# Patient Record
Sex: Female | Born: 1955 | Hispanic: Yes | Marital: Married | Smoking: Never smoker
Health system: Southern US, Community
[De-identification: ages and names within clinical notes are randomized; demographics above are authoritative.]

## PROBLEM LIST (undated history)

## (undated) DIAGNOSIS — R32 Unspecified urinary incontinence: Secondary | ICD-10-CM

## (undated) DIAGNOSIS — N952 Postmenopausal atrophic vaginitis: Secondary | ICD-10-CM

## (undated) DIAGNOSIS — IMO0002 Reserved for concepts with insufficient information to code with codable children: Secondary | ICD-10-CM

## (undated) DIAGNOSIS — E119 Type 2 diabetes mellitus without complications: Secondary | ICD-10-CM

## (undated) DIAGNOSIS — I10 Essential (primary) hypertension: Secondary | ICD-10-CM

## (undated) HISTORY — PX: CHOLECYSTECTOMY: SHX55

## (undated) HISTORY — DX: Unspecified urinary incontinence: R32

## (undated) HISTORY — DX: Essential (primary) hypertension: I10

## (undated) HISTORY — DX: Reserved for concepts with insufficient information to code with codable children: IMO0002

## (undated) HISTORY — PX: TUBAL LIGATION: SHX77

## (undated) HISTORY — DX: Type 2 diabetes mellitus without complications: E11.9

## (undated) HISTORY — PX: SHOULDER SURGERY: SHX246

## (undated) HISTORY — DX: Postmenopausal atrophic vaginitis: N95.2

---

## 1999-01-12 ENCOUNTER — Encounter: Payer: Self-pay | Admitting: Emergency Medicine

## 1999-01-12 ENCOUNTER — Emergency Department (HOSPITAL_COMMUNITY): Admission: EM | Admit: 1999-01-12 | Discharge: 1999-01-12 | Payer: Self-pay

## 1999-08-23 ENCOUNTER — Emergency Department (HOSPITAL_COMMUNITY): Admission: EM | Admit: 1999-08-23 | Discharge: 1999-08-23 | Payer: Self-pay | Admitting: Emergency Medicine

## 1999-10-05 ENCOUNTER — Encounter: Admission: RE | Admit: 1999-10-05 | Discharge: 1999-10-05 | Payer: Self-pay | Admitting: Orthopedic Surgery

## 1999-10-05 ENCOUNTER — Encounter: Payer: Self-pay | Admitting: Orthopedic Surgery

## 1999-11-23 ENCOUNTER — Other Ambulatory Visit: Admission: RE | Admit: 1999-11-23 | Discharge: 1999-11-23 | Payer: Self-pay | Admitting: Obstetrics and Gynecology

## 1999-11-28 ENCOUNTER — Encounter: Payer: Self-pay | Admitting: Obstetrics and Gynecology

## 1999-11-28 ENCOUNTER — Ambulatory Visit (HOSPITAL_COMMUNITY): Admission: RE | Admit: 1999-11-28 | Discharge: 1999-11-28 | Payer: Self-pay | Admitting: Obstetrics and Gynecology

## 1999-11-29 ENCOUNTER — Encounter: Payer: Self-pay | Admitting: Obstetrics and Gynecology

## 1999-11-29 ENCOUNTER — Ambulatory Visit (HOSPITAL_COMMUNITY): Admission: RE | Admit: 1999-11-29 | Discharge: 1999-11-29 | Payer: Self-pay | Admitting: Obstetrics and Gynecology

## 2002-02-23 ENCOUNTER — Emergency Department (HOSPITAL_COMMUNITY): Admission: EM | Admit: 2002-02-23 | Discharge: 2002-02-24 | Payer: Self-pay | Admitting: Emergency Medicine

## 2003-06-08 ENCOUNTER — Other Ambulatory Visit: Admission: RE | Admit: 2003-06-08 | Discharge: 2003-06-08 | Payer: Self-pay | Admitting: Gynecology

## 2003-07-06 ENCOUNTER — Encounter: Admission: RE | Admit: 2003-07-06 | Discharge: 2003-07-06 | Payer: Self-pay | Admitting: Obstetrics and Gynecology

## 2008-03-09 ENCOUNTER — Inpatient Hospital Stay (HOSPITAL_COMMUNITY): Admission: AD | Admit: 2008-03-09 | Discharge: 2008-03-09 | Payer: Self-pay | Admitting: Family Medicine

## 2008-09-07 ENCOUNTER — Other Ambulatory Visit: Admission: RE | Admit: 2008-09-07 | Discharge: 2008-09-07 | Payer: Self-pay | Admitting: Obstetrics & Gynecology

## 2009-06-15 ENCOUNTER — Ambulatory Visit: Payer: Self-pay | Admitting: Obstetrics and Gynecology

## 2009-06-15 LAB — CONVERTED CEMR LAB
Platelets: 509 10*3/uL — ABNORMAL HIGH (ref 150–400)
RBC: 4.96 M/uL (ref 3.87–5.11)
WBC: 8.6 10*3/uL (ref 4.0–10.5)

## 2009-06-23 ENCOUNTER — Ambulatory Visit (HOSPITAL_COMMUNITY): Admission: RE | Admit: 2009-06-23 | Discharge: 2009-06-23 | Payer: Self-pay | Admitting: Obstetrics & Gynecology

## 2009-07-07 ENCOUNTER — Encounter: Payer: Self-pay | Admitting: Obstetrics & Gynecology

## 2009-07-07 ENCOUNTER — Ambulatory Visit: Payer: Self-pay | Admitting: Obstetrics & Gynecology

## 2009-07-07 LAB — CONVERTED CEMR LAB
HCT: 29.5 % — ABNORMAL LOW (ref 36.0–46.0)
MCV: 57.7 fL — ABNORMAL LOW (ref 78.0–100.0)
RBC: 5.11 M/uL (ref 3.87–5.11)
TSH: 2.208 microintl units/mL (ref 0.350–4.500)
WBC: 8.4 10*3/uL (ref 4.0–10.5)

## 2009-07-13 ENCOUNTER — Ambulatory Visit (HOSPITAL_COMMUNITY): Admission: RE | Admit: 2009-07-13 | Discharge: 2009-07-13 | Payer: Self-pay | Admitting: Obstetrics & Gynecology

## 2009-10-13 ENCOUNTER — Ambulatory Visit: Payer: Self-pay | Admitting: Obstetrics & Gynecology

## 2009-11-16 ENCOUNTER — Ambulatory Visit (HOSPITAL_COMMUNITY): Admission: RE | Admit: 2009-11-16 | Discharge: 2009-11-16 | Payer: Self-pay | Admitting: Obstetrics & Gynecology

## 2009-11-16 ENCOUNTER — Ambulatory Visit: Payer: Self-pay | Admitting: Obstetrics & Gynecology

## 2009-12-14 ENCOUNTER — Ambulatory Visit: Payer: Self-pay | Admitting: Obstetrics and Gynecology

## 2010-07-23 HISTORY — PX: ENDOMETRIAL ABLATION: SHX621

## 2010-10-10 LAB — CBC
HCT: 31.1 % — ABNORMAL LOW (ref 36.0–46.0)
Hemoglobin: 9.6 g/dL — ABNORMAL LOW (ref 12.0–15.0)
MCV: 64.3 fL — ABNORMAL LOW (ref 78.0–100.0)
Platelets: 399 10*3/uL (ref 150–400)
WBC: 9.1 10*3/uL (ref 4.0–10.5)

## 2010-10-10 LAB — BASIC METABOLIC PANEL
BUN: 6 mg/dL (ref 6–23)
CO2: 26 mEq/L (ref 19–32)
Chloride: 103 mEq/L (ref 96–112)
Creatinine, Ser: 0.56 mg/dL (ref 0.4–1.2)
Potassium: 3.6 mEq/L (ref 3.5–5.1)

## 2011-03-18 IMAGING — US US TRANSVAGINAL NON-OB
1 series · 13 of 25 positions shown · non-contrast
Comparison: Report from exam dated 11/28/1999

CLINICAL DATA: History of fibroids.  Dysfunctional uterine
bleeding.  LMP 06/14/2009

TRANSABDOMINAL AND TRANSVAGINAL ULTRASOUND OF PELVIS
TECHNIQUE: Both transabdominal and transvaginal ultrasound
examinations of the pelvis were performed including evaluation of
the uterus, ovaries, adnexal regions, and pelvic cul-de-sac.

[Series 1: us pelvis complete modify · 0.27mm/px · 13 of 37 slices shown]
[im 1/37]
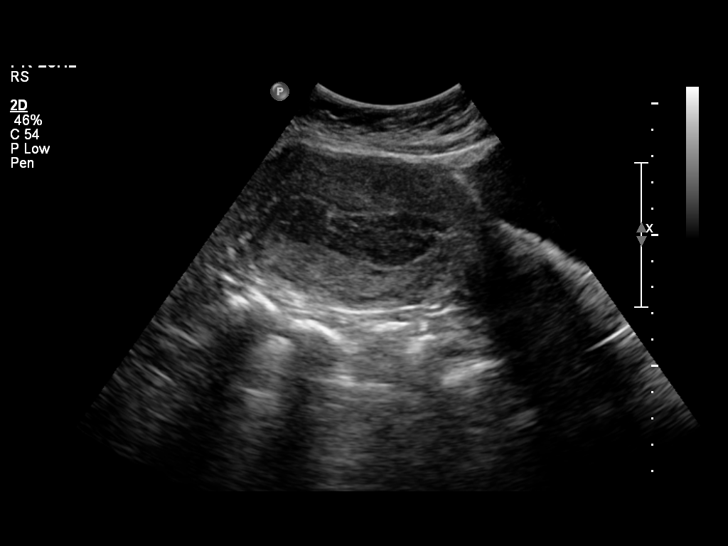
[im 4/37]
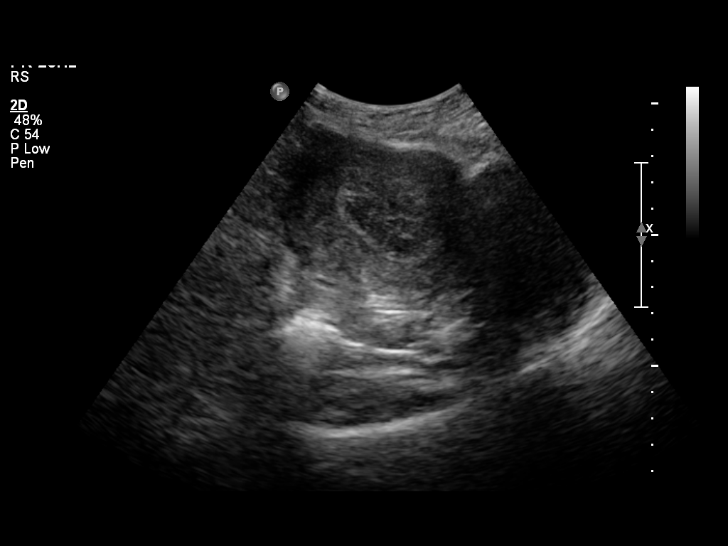
[im 7/37]
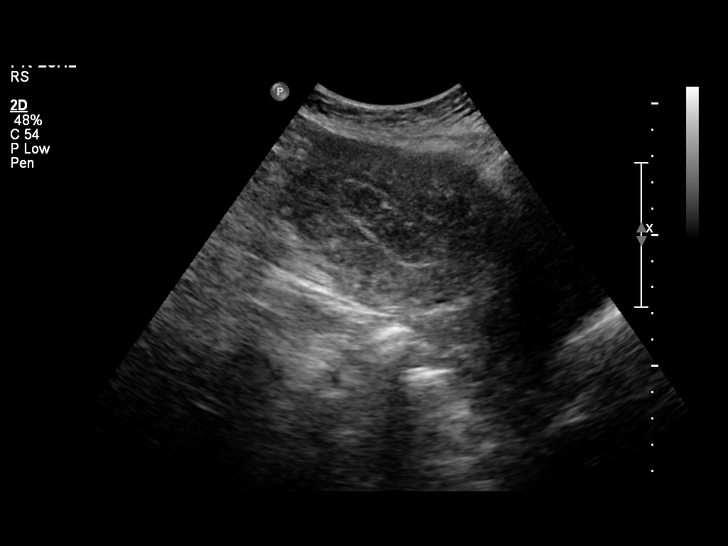
[im 10/37]
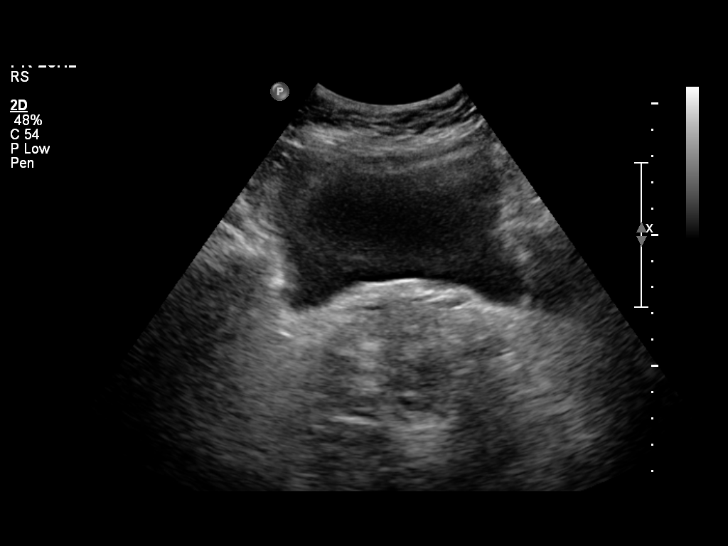
[im 13/37]
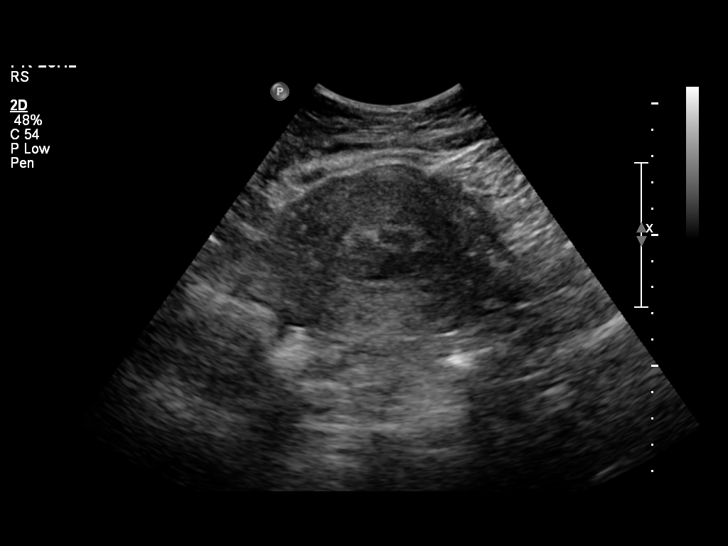
[im 16/37]
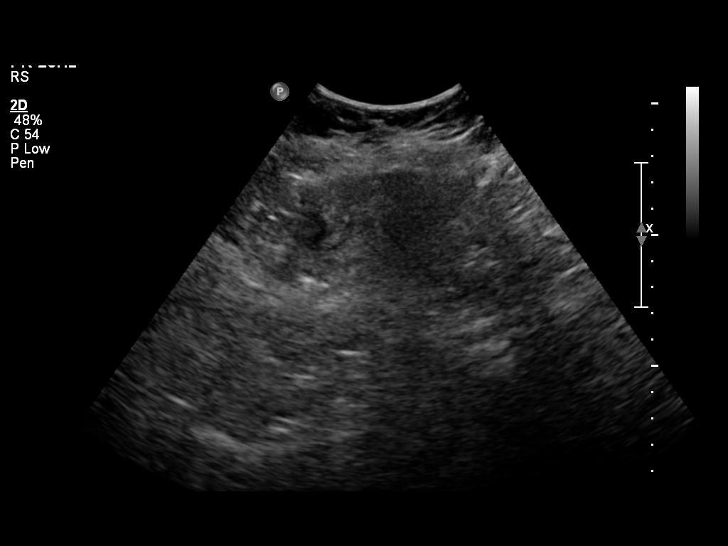
[im 19/37]
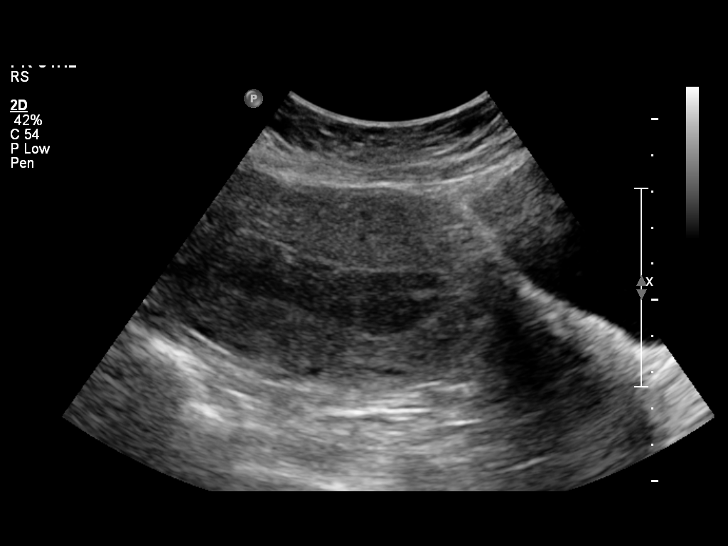
[im 22/37]
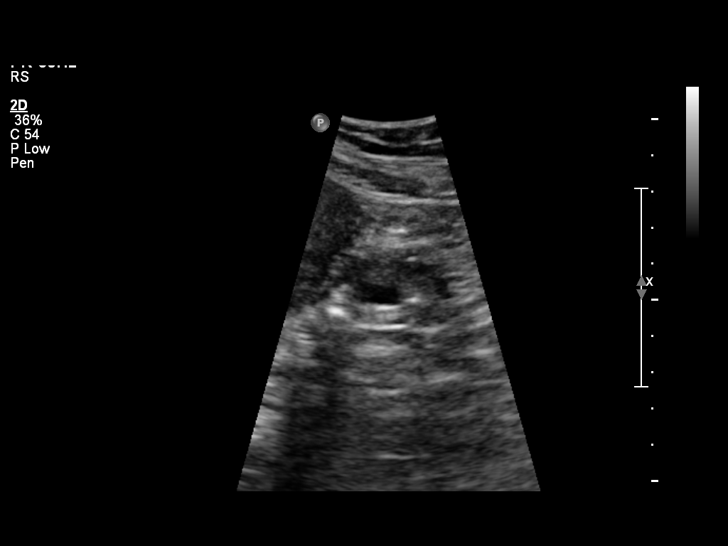
[im 25/37]
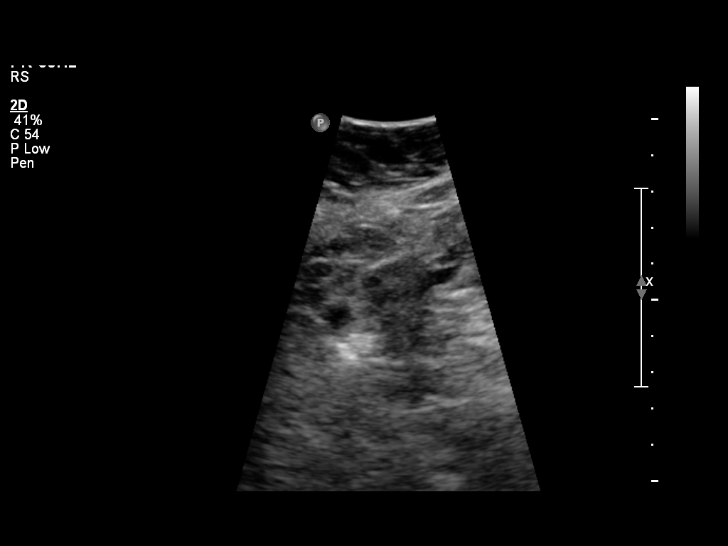
[im 28/37]
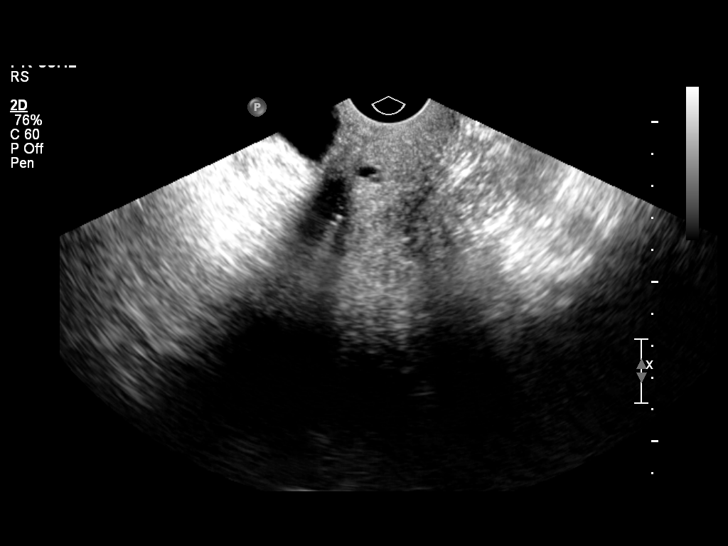
[im 31/37]
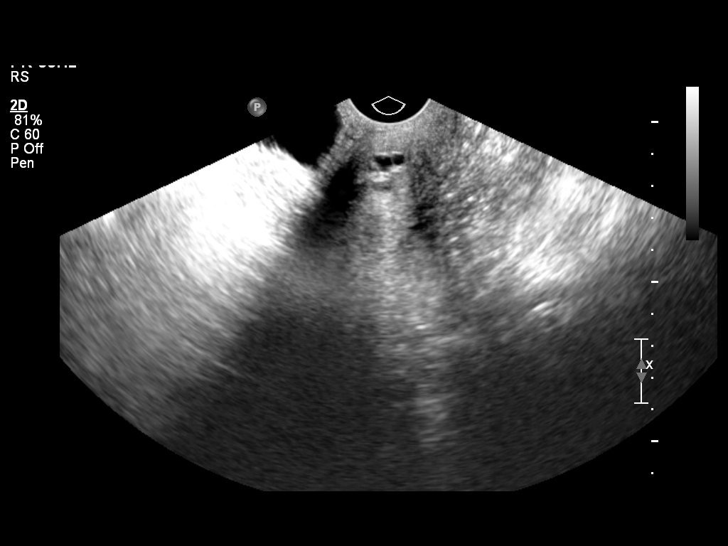
[im 34/37]
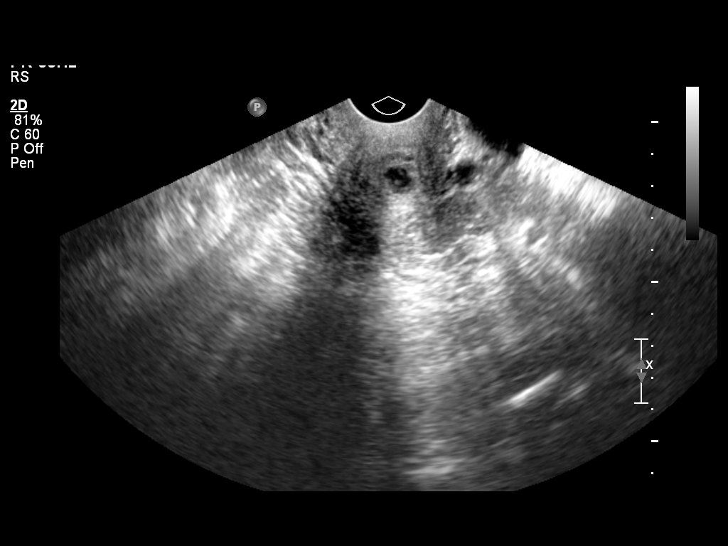
[im 37/37]
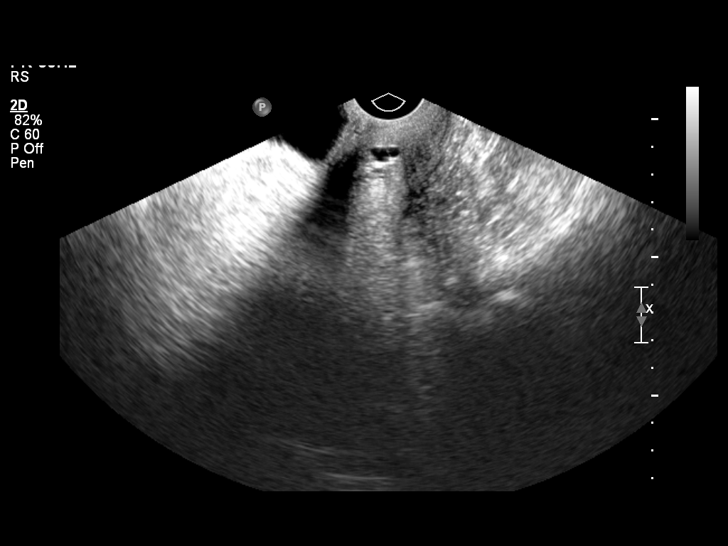

[13 of 25 positions shown; findings below may reference images not displayed]

FINDINGS: Uterus is enlarged with a sagittal length of 14.7 cm, an AP width
of 6.2 cm and a transverse width of 8.5 cm.  The uterus is
evaluated only transabdominally.  The uterus could not be visualize
above the level of the cervix endovaginally. No definite focal
myometrial abnormality is suggested with transabdominal assessment.

Endometrium The endometrial canal appears to be distended with
hypoechoic soft tissue surrounded by a thin echogenic line.  This
has an AP width of 2.1 cm and shows no flow with color doppler
assessment. I suspect that this represents intraluminal blood with
a thin surrounding endometrial lining.  Because of poor endovaginal
penetration and the large uterine size, this could not be confirmed
endovaginally.

Right Ovary has a normal transabdominal appearance measuring 3.0 x
1.6 x 2.0 cm

Left Ovary has a normal transabdominal appearance measuring 3.2 x
1.9 x 2.5 cm

Other Findings:  No pelvic fluid or separate adnexal masses are
seen.
IMPRESSION: Pelvic evaluation could only be obtained transabdominally due to
the large uterine size and poor penetration beyond the level of the
cervix endovaginally.  The uterus is enlarged with no definite
focal fibroids seen.  The endometrial canal appears distended by
intraluminal blood with the endometrial lining itself appearing
thin transabdominally.  Because of the poor visibility
endovaginally and the question of retained blood within the
endometrial canal, further assessment with MRI would be useful to
confirm this impression and evaluate for the possibility of a
partial obstructive process in the lower uterine segment which was
the least well seen portion of the uterus.

Normal ovaries

## 2011-08-18 ENCOUNTER — Ambulatory Visit: Payer: Self-pay

## 2011-09-06 ENCOUNTER — Ambulatory Visit: Payer: Self-pay | Admitting: Physician Assistant

## 2011-09-06 DIAGNOSIS — N760 Acute vaginitis: Secondary | ICD-10-CM

## 2011-09-06 DIAGNOSIS — N949 Unspecified condition associated with female genital organs and menstrual cycle: Secondary | ICD-10-CM

## 2011-09-06 LAB — POCT WET PREP WITH KOH
Epithelial Wet Prep HPF POC: NEGATIVE
KOH Prep POC: NEGATIVE
Yeast Wet Prep HPF POC: NEGATIVE

## 2011-09-06 MED ORDER — CEFTRIAXONE SODIUM 1 G IJ SOLR
250.0000 mg | Freq: Once | INTRAMUSCULAR | Status: AC
  Administered 2011-09-06: 250 mg via INTRAMUSCULAR

## 2011-09-06 MED ORDER — AZITHROMYCIN 1 G PO PACK: 1.0000 | PACK | Freq: Once | ORAL | Status: AC

## 2011-09-06 NOTE — Patient Instructions (Signed)
No sexual activity until after results are back because husband may also need treatment.

## 2011-09-06 NOTE — Progress Notes (Signed)
Patient ID: Sandra Gardner, female   DOB: 1956/03/18, 56 y.o.   MRN: [id number] C/o vaginal discharge, burning, and itching. Present x 3 weeks. Monogamous with husband.  Denies STD risk factors.  ROS: Neg other than mild pelvic achiness.  O: lungs CTA B Heart RRR w/o m/g/r Pelvic: vulva and ext. Anatomy w/in normal limits.  No lesions. Purulent appearing vaginal d/c.  Cervix erythematous.  Wet prep and gen probe taken.  Bimanual unremarkable.  Results for orders placed in visit on 09/06/11  POCT WET PREP WITH KOH      Component Value Range   Trichomonas, UA Negative     Clue Cells Wet Prep HPF POC 3-4     Epithelial Wet Prep HPF POC neg     Yeast Wet Prep HPF POC neg     Bacteria Wet Prep HPF POC 4+     RBC Wet Prep HPF POC 2-3     WBC Wet Prep HPF POC tntc     KOH Prep POC Negative       A/P  Likely cervicitis. Cover for Chlamydia and Gonorrhea. 250mg  rocephin IM now, 1g azithromycin now.  Husband came in room after exam.  Discussed that he will likely need treatment too.  No sex until results back.

## 2011-09-12 ENCOUNTER — Telehealth: Payer: Self-pay

## 2011-09-12 NOTE — Telephone Encounter (Signed)
Lmom of nl labs

## 2011-09-12 NOTE — Telephone Encounter (Signed)
Patient returning nurse phone call she is calling about lab results

## 2013-05-02 ENCOUNTER — Encounter (HOSPITAL_COMMUNITY): Payer: Self-pay | Admitting: Emergency Medicine

## 2013-05-02 ENCOUNTER — Emergency Department (HOSPITAL_COMMUNITY)
Admission: EM | Admit: 2013-05-02 | Discharge: 2013-05-02 | Disposition: A | Discharge status: Home / Self Care | Payer: Self-pay | Attending: Emergency Medicine | Admitting: Emergency Medicine

## 2013-05-02 DIAGNOSIS — S0990XA Unspecified injury of head, initial encounter: Secondary | ICD-10-CM | POA: Insufficient documentation

## 2013-05-02 DIAGNOSIS — S139XXA Sprain of joints and ligaments of unspecified parts of neck, initial encounter: Secondary | ICD-10-CM | POA: Insufficient documentation

## 2013-05-02 DIAGNOSIS — R011 Cardiac murmur, unspecified: Secondary | ICD-10-CM | POA: Insufficient documentation

## 2013-05-02 DIAGNOSIS — Y9389 Activity, other specified: Secondary | ICD-10-CM | POA: Insufficient documentation

## 2013-05-02 DIAGNOSIS — R42 Dizziness and giddiness: Secondary | ICD-10-CM | POA: Insufficient documentation

## 2013-05-02 DIAGNOSIS — IMO0002 Reserved for concepts with insufficient information to code with codable children: Secondary | ICD-10-CM | POA: Insufficient documentation

## 2013-05-02 DIAGNOSIS — R519 Headache, unspecified: Secondary | ICD-10-CM

## 2013-05-02 DIAGNOSIS — Y990 Civilian activity done for income or pay: Secondary | ICD-10-CM | POA: Insufficient documentation

## 2013-05-02 DIAGNOSIS — S161XXA Strain of muscle, fascia and tendon at neck level, initial encounter: Secondary | ICD-10-CM

## 2013-05-02 DIAGNOSIS — Y929 Unspecified place or not applicable: Secondary | ICD-10-CM | POA: Insufficient documentation
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
## 2013-05-08 NOTE — ED Provider Notes (Signed)
I saw and evaluated the patient, reviewed the resident's note and I agree with the findings and plan.   .Face to face Exam:  General:  Awake HEENT: PERRLA Resp:  Normal effort Abd:  Nondistended Neuro:No focal weakness Lymph: No adenopathy  Nelia Shi, MD 05/08/13 [phone]

## 2013-09-07 ENCOUNTER — Inpatient Hospital Stay (HOSPITAL_COMMUNITY)
Admission: AD | Admit: 2013-09-07 | Discharge: 2013-09-07 | Disposition: A | Discharge status: Home / Self Care | Payer: Self-pay | Source: Ambulatory Visit | Attending: Obstetrics & Gynecology | Admitting: Obstetrics & Gynecology

## 2013-09-07 ENCOUNTER — Encounter (HOSPITAL_COMMUNITY): Payer: Self-pay | Admitting: *Deleted

## 2013-09-07 DIAGNOSIS — N393 Stress incontinence (female) (male): Secondary | ICD-10-CM | POA: Insufficient documentation

## 2013-09-07 DIAGNOSIS — N8111 Cystocele, midline: Secondary | ICD-10-CM | POA: Insufficient documentation

## 2013-09-07 DIAGNOSIS — R109 Unspecified abdominal pain: Secondary | ICD-10-CM | POA: Insufficient documentation

## 2013-09-07 DIAGNOSIS — IMO0002 Reserved for concepts with insufficient information to code with codable children: Secondary | ICD-10-CM | POA: Insufficient documentation

## 2013-09-07 DIAGNOSIS — N952 Postmenopausal atrophic vaginitis: Secondary | ICD-10-CM | POA: Insufficient documentation

## 2013-09-07 LAB — URINALYSIS, ROUTINE W REFLEX MICROSCOPIC
Bilirubin Urine: NEGATIVE
Glucose, UA: NEGATIVE mg/dL
Ketones, ur: NEGATIVE mg/dL
NITRITE: NEGATIVE
Protein, ur: NEGATIVE mg/dL
SPECIFIC GRAVITY, URINE: 1.01 (ref 1.005–1.030)
UROBILINOGEN UA: 0.2 mg/dL (ref 0.0–1.0)
pH: 5.5 (ref 5.0–8.0)

## 2013-09-07 LAB — URINE MICROSCOPIC-ADD ON

## 2013-09-07 NOTE — MAU Provider Note (Signed)
History     CSN: [id number]  Arrival date and time: 09/07/13 1554   First Provider Initiated Contact with Patient 09/07/13 1650      Chief Complaint  Patient presents with  . vag pressure/pain    HPI [name] is a 58 y.o. [phone] who presents to MAU today with complaint of painful vaginal irritation and dyspareunia x 1 month. The patient also endorses occasional lower abdominal pain and dysuria. She denies vaginal discharge, bleeding or fever. She states incontinence issues with coughing and sneezing. She also states a heavy feeling in her lower abdomen at times.   OB History   Grav Para Term Preterm Abortions TAB SAB Ect Mult Living   4 3   1 1    3       History reviewed. No pertinent past medical history.  Past Surgical History  Procedure Laterality Date  . Cesarean section    . Cholecystectomy    . Endometrial ablation  2012  . Tubal ligation      BTL-  [id number]-  02-1984    History reviewed. No pertinent family history.  History  Substance Use Topics  . Smoking status: Never Smoker   . Smokeless tobacco: Not on file  . Alcohol Use: No    Allergies: No Known Allergies  Prescriptions prior to admission  Medication Sig Dispense Refill  . naproxen sodium (ANAPROX) 220 MG tablet Take 220 mg by mouth as needed (for pain).         Review of Systems  Constitutional: Negative for fever and malaise/fatigue.  Gastrointestinal: Positive for abdominal pain. Negative for nausea, vomiting, diarrhea and constipation.  Genitourinary: Positive for dysuria. Negative for urgency and frequency.       Neg - vaginal bleeding, discharge   Physical Exam   Blood pressure 174/89, pulse 70, temperature 98.7 F (37.1 C), temperature source Oral, resp. rate 18, height 4\' 9"  (1.448 m), weight 126 lb (57.153 kg).  Physical Exam  Constitutional: She is oriented to person, place, and time. She appears well-developed and well-nourished. No distress.  HENT:  Head: Normocephalic and  atraumatic.  Cardiovascular: Normal rate.   Respiratory: Effort normal.  GI: Soft. Bowel sounds are normal. She exhibits no distension and no mass. There is no tenderness. There is no rebound and no guarding.  Genitourinary:    Uterus is not enlarged and not tender. Cervix exhibits no motion tenderness, no discharge and no friability. Right adnexum displays no mass and no tenderness. Left adnexum displays no mass and no tenderness. No bleeding around the vagina. No vaginal discharge found.  Neurological: She is alert and oriented to person, place, and time.  Skin: Skin is warm and dry. No erythema.  Psychiatric: She has a normal mood and affect.   Results for orders placed during the hospital encounter of 09/07/13 (from the past 24 hour(s))  URINALYSIS, ROUTINE W REFLEX MICROSCOPIC     Status: Abnormal   Collection Time    09/07/13  4:25 PM      Result Value Ref Range   Color, Urine YELLOW  YELLOW   APPearance CLEAR  CLEAR   Specific Gravity, Urine 1.010  1.005 - 1.030   pH 5.5  5.0 - 8.0   Glucose, UA NEGATIVE  NEGATIVE mg/dL   Hgb urine dipstick TRACE (*) NEGATIVE   Bilirubin Urine NEGATIVE  NEGATIVE   Ketones, ur NEGATIVE  NEGATIVE mg/dL   Protein, ur NEGATIVE  NEGATIVE mg/dL   Urobilinogen, UA 0.2  0.0 -  1.0 mg/dL   Nitrite NEGATIVE  NEGATIVE   Leukocytes, UA TRACE (*) NEGATIVE  URINE MICROSCOPIC-ADD ON     Status: None   Collection Time    09/07/13  4:25 PM      Result Value Ref Range   Squamous Epithelial / LPF RARE  RARE   WBC, UA 3-6  <3 WBC/hpf   RBC / HPF 0-2  <3 RBC/hpf   Bacteria, UA RARE  RARE    MAU Course  Procedures None  MDM UA today  Assessment and Plan  A: Cystocele Stress incontinence Vaginal atrophy  P: Discharge home Recommended water-based lubricants for intercourse Referred to [facility] clinic for consult with MD and follow-up Patient may return to MAU as needed or if her condition were to change or worsen  Freddi Starr, PA-C  09/07/2013,  4:50 PM

## 2013-09-07 NOTE — Discharge Instructions (Signed)
Dispareunia  (Dyspareunia) La dispareunia es el dolor durante las relaciones sexuales. Es ms comn en las mujeres, pero tambin ocurre [employer].  CAUSAS  Femenino El dolor se siente cuando algo penetra en la vagina, pero cualquier parte de los genitales pueden causar dolor durante las relaciones sexuales. Puede sentir dolor incluso al sentarse o usar pantalones. En algunos casos no se conoce la causa. Algunas causas:   Infecciones de la piel que rodea la vagina.  Las infecciones vaginales, tales como hongos, bacterias o infeccin viral.  Vaginismo. Es la imposibilidad de Warehouse manager algo en la vagina, incluso queriendo [place]. Hay una contraccin muscular automtica y Engineer, mining. El dolor de la contraccin muscular puede ser tan intenso que el coito es imposible.  Reaccin alrgica a los espermicidas, semen, preservativos, tampones perfumados, jabones, duchas y [employer].  Un saco lleno de lquido (quiste) en las glndulas de [place] o de [place], que se encuentra en la apertura de la vagina.  Tejido cicatrizal en la vagina por un agrandamiento quirrgico de la abertura (episiotoma) o desgarro despus de Warehouse manager un beb.  Sequedad vaginal. Esto es ms frecuente en la menopausia. Las secreciones normales de la vagina estn disminuidas. Los [employer] niveles de estrgeno y la mayor dificultad para excitarse pueden causar dolor durante el sexo. La sequedad vaginal tambin puede ocurrir cuando se toman pldoras anticonceptivas.  Adelgazamiento del tejido (atrofia) de la vulva y la vagina. Esto hace que la zona sea ms delgada, ms pequea, incapaz de estirarse para acomodar el pene, y propensa a infecciones y Insurance account manager.  Vestibulitis vulvar o vestibulodinia. Esta es una enfermedad que causa dolor y afecta el rea alrededor de la entrada de la vagina. La causa ms comn en las mujeres jvenes son las pldoras anticonceptivas. Las mujeres con niveles bajos de estrgenos (mujeres  posmenopusicas) tambin pueden experimentarlo.Otras causas incluyen reacciones alrgicas, gran cantidad de terminaciones nerviosas, afecciones de la piel y msculos plvicos que no pueden relajarse.  Dermatosis vulvar. Esto incluye enfermedades de la piel como el liquen escleroso y liquen plano.  Falta de juegos previos para la lubricacin de la vagina. Esto puede causar sequedad vaginal.  Los tumores no cancerosos (fibromas) en el tero.  El tejido que reviste internamente al tero se desarrolla fuera del mismo (endometriosis).  Embarazo que comienza en la trompa de [place] (embarazo tubrico).  El embarazo o estar amamantando al beb. Esto puede causar sequedad vaginal.  Inclinacin o prolapso del tero. El prolapso se produce cuando los msculos dbiles y estirados alrededor del tero dejan que caiga en la vagina.  Problemas en los ovarios, quistes o cicatrices. Esto puede ser peor con ciertas posiciones sexuales.  Cirugas previas causando adherencias o tejido cicatrizal en la vagina o la pelvis.  Problemas en la vejiga e intestinales.  Problemas psicolgicos (como depresin o ansiedad). Esto puede hacer que el dolor empeore.  Actitudes negativas con respecto al sexo, haber sufrido una violacin, agresin sexual, y Warehouse manager. Estos problemas suelen estar relacionados con algunos tipos de dolor.  Infeccin plvica previa, causando un tejido cicatrizal en la pelvis y en los rganos femeninos.  Quiste o tumor en el ovario.  Cncer en los rganos femeninos.  Ciertos medicamentos.  Enfermedades como diabetes, artritis, o enfermedad de la tiroides. Masculino En los hombres, hay muchas causas fsicas del malestar sexual. Algunas causas:   Infecciones de la prstata, la vejiga, o las vesculas seminales. Pueden causar dolor despus de Automotive engineer.  Inflamacin de la vejiga (cistitis  intersticial). Puede causar dolor durante la eyaculacin.  Infecciones  por gonorrea. Puede causar dolor durante la eyaculacin.  Inflamacin de la uretra (uretritis) o inflamacin de la prstata (prostatitis) . Puede hacer que la estimulacin genital sea dolorosa o incmoda.  Deformidades del pene como la enfermedad de [place]ronie.  Un prepucio estrecho.  Cncer en los rganos reproductivos masculinos.  Problemas psiclogicos. Esto puede hacer que el dolor empeore. DIAGNSTICO   El mdico har la historia clnica y tendr que describir el lugar donde se Optometristlocaliza el dolor (fuera de la vagina, en la vagina, en la pelvis). Le preguntar en qu momento experimenta el dolor, durante la penetracin o con los choques.  Luego el Office Depotmdico le har un examen fsico. Hgale saber si sufre dolor durante el examen.  Durante la parte final del examen femenino, su mdico le palpar el tero y los ovarios con la mano sobre el abdomen y un dedo en la vagina. Es un examen plvico.  Le pedir anlisis de sangre, un Papanicolaou, cultivos en busca de infecciones, una ecografa y radiografas. Es posible que necesite ver a un especialista por problemas femeninos (gineclogo).  Su mdico indicar que se haga una tomografa computada, una resonancia magntica o una laparoscopia. La laparoscopia es un procedimiento para examinar la pelvis con un tubo con luz, a travs de un corte (incisin) en el abdomen. TRATAMIENTO  Su mdico determinar el mejor curso de Sawgrasstratamiento. En algunos casos se solicitan ms pruebas. Contine con los estudios indicados hasta que el mdico est seguro acerca del diagnstico y [place]un[place]ael tratamiento. A veces, es difcil de Veterinary surgeonencontrar la causa del dolor. La bsqueda de la causa y el tratamiento pueden ser frustrantes. El tratamiento generalmente demora varias semanas o meses antes de notar [place]una mejora. Tambin puede ser necesario evitar la actividad sexual hasta que los sntomas mejoren. Si sigue teniendo Clinical research associaterelaciones sexuales cuando tiene dolor puede Engineer, manufacturingretrasar la curacin y  Diplomatic Services operational officerempeorar el problema.  El tratamiento depende de la causa del dolor. Puede incluir:   Medicamentos como antibiticos, cremas vaginales, para la piel, hormonas, o antidepresivos.  Ciruga mayor o menor.  El asesoramiento psicolgico o terapia de Beltongrupo.  Ejercicios de Kegel y dilatadores vaginales para ayudar en ciertos casos de vaginismo (espasmos). Hgalos slo si se lo recomienda su mdico. Los ejercicios Kegel pueden hacer que algunos problemas empeoren.  La aplicacin de un lubricante segn las indicaciones del mdico si tiene sequedad.  Terapia sexual para usted y su compaero. Es comn que el dolor contine despus de tratar la causa. Puede ser por Neomia Dearuna respuesta condicionada. Esto significa la persona se vuelve tan familiar con el dolor que persiste como Al[place] Viejorespuesta, aunque se elimine la causa. La terapia sexual puede ayudar con este problema.  INSTRUCCIONES PARA EL CUIDADO EN EL HOGAR   Siga las instrucciones de su mdico acerca de como Golden West Financialtomar los medicamentos, las Stoney Pointpruebas, terapias y visitas de control.  No use tampones, duchas vaginales , aerosoles vaginales o jabones perfumados.  Use lubricantes a base de agua para la sequedad. Los lubricantes con aceites pueden causar irritacin.  No utilice espermicidas o condones que Ecologistle irriten.  Hable abiertamente con su pareja sobre su experiencia sexual , sus deseos, el juego previo y las diferentes posiciones sexuales para Neomia Dearuna relacin sexual ms cmoda y Geographical information systems officeragradable.  nase a sesiones de Gabonterapia de Byramgrupo, si es necesario.  Practique sexo seguro en todo momento.  Vace su vejiga antes de Management consultanttener relaciones sexuales.  Pruebe diferentes posiciones durante el coito.  Johnson & Johnsonome  medicamentos de venta libre para Chief Technology Officer segn las indicaciones del mdico, antes de Management consultant.  No use panties. Use las medias que llegan a la altura de la rodilla o del muslo.  Evite frotar la vulva con un pao. Lave suavemente la zona y squela con  una toalla dando golpecitos. SOLICITE ATENCIN MDICA SI:   Tiene hemorragias durante las [employer].  Observa un bulto en la abertura de la vagina, aunque no sea doloroso.  Tiene flujo vaginal anormal.  Tiene sequedad vaginal.  Siente tiene picazn o irritacin de la vulva o la vagina.  Aparece una erupcin o tiene una reaccin a los medicamentos. SOLICITE ATENCIN MDICA DE INMEDIATO SI:   Siente dolor abdominal intenso durante o poco despus de las [employer]. Usted podra haber sufrido la ruptura de un quiste ovrico o un embarazo ectpico.  Tiene fiebre.  Comienza a Financial risk analyst al orinar u [employer].  Tiene relaciones sexuales dolorosas, y Forensic scientist ocurri antes.  Se desmaya despus de Management consultant. Document Released: 03/21/2011 Document Revised: 10/01/2011 [facility] Patient Information 2014 [place], [place].  Lubricant for sex: Water-based lubricant

## 2013-09-07 NOTE — MAU Note (Addendum)
PT SAYS HER PERINEUM IS SWOLLEN- STARTED  IN DEC-   NO DR.    SOMETIMES IT BURNS, NO ITCHING , NO VAG D/C.Marland Kitchen.  NO CYCLE SINCE 11-2011.Marland Kitchen.   LAST SEX- JAN-  HURT BAD.    BTL - 1985   WHEN SHE SNEEZES/ COUGHS- URINE  COMES OUT

## 2013-09-07 NOTE — MAU Note (Signed)
Pelvic/vaginal  Pressure. Feels like something is coming. Worse when up and walking.  First noted about a  Month ago- worse today- very uncomfortable.

## 2013-09-23 ENCOUNTER — Ambulatory Visit (INDEPENDENT_AMBULATORY_CARE_PROVIDER_SITE_OTHER): Payer: Self-pay | Admitting: Obstetrics & Gynecology

## 2013-09-23 ENCOUNTER — Encounter: Payer: Self-pay | Admitting: Obstetrics & Gynecology

## 2013-09-23 VITALS — BP 135/91 | HR 67 | Temp 98.2°F | Ht <= 58 in | Wt 121.7 lb

## 2013-09-23 DIAGNOSIS — R102 Pelvic and perineal pain: Principal | ICD-10-CM

## 2013-09-23 DIAGNOSIS — Z0000 Encounter for general adult medical examination without abnormal findings: Secondary | ICD-10-CM

## 2013-09-23 DIAGNOSIS — G8929 Other chronic pain: Secondary | ICD-10-CM

## 2013-09-23 DIAGNOSIS — Z23 Encounter for immunization: Secondary | ICD-10-CM

## 2013-09-23 DIAGNOSIS — N949 Unspecified condition associated with female genital organs and menstrual cycle: Secondary | ICD-10-CM

## 2013-09-23 NOTE — Progress Notes (Signed)
   Subjective:    Patient ID: Sandra Gardner, female    DOB: 15-Nov-1955, 58 y.o.   MRN: [id number]  HPI   She is here today with 2 problems. She has vaginal atrophy and cystocele for 2 months. She has had dyspareunia for 2 months. She also has had GSUI for 2 months. She feels pelvic pressure and burning.   Review of Systems   pap and mammogram due  Objective:   Physical Exam        Assessment & Plan:  Preventative- schedule mammogram, pap in 1 month Cystocele- rec Kegel's atropy- compounded estrogen 1 gram 3 times per week Pelvic pain- schedule u/s

## 2013-10-01 ENCOUNTER — Ambulatory Visit (HOSPITAL_COMMUNITY)
Admission: RE | Admit: 2013-10-01 | Discharge: 2013-10-01 | Disposition: A | Discharge status: Home / Self Care | Payer: Self-pay | Source: Ambulatory Visit | Attending: Obstetrics & Gynecology | Admitting: Obstetrics & Gynecology

## 2013-10-01 DIAGNOSIS — N949 Unspecified condition associated with female genital organs and menstrual cycle: Secondary | ICD-10-CM | POA: Insufficient documentation

## 2013-10-01 DIAGNOSIS — G8929 Other chronic pain: Secondary | ICD-10-CM

## 2013-10-01 DIAGNOSIS — R102 Pelvic and perineal pain: Secondary | ICD-10-CM

## 2013-10-13 ENCOUNTER — Ambulatory Visit (HOSPITAL_COMMUNITY)
Admission: RE | Admit: 2013-10-13 | Discharge: 2013-10-13 | Disposition: A | Discharge status: Home / Self Care | Payer: Self-pay | Source: Ambulatory Visit | Attending: Obstetrics & Gynecology | Admitting: Obstetrics & Gynecology

## 2013-10-13 DIAGNOSIS — Z0000 Encounter for general adult medical examination without abnormal findings: Secondary | ICD-10-CM

## 2013-11-16 ENCOUNTER — Encounter: Payer: Self-pay | Admitting: *Deleted

## 2013-11-28 ENCOUNTER — Emergency Department (HOSPITAL_COMMUNITY)
Admission: EM | Admit: 2013-11-28 | Discharge: 2013-11-28 | Disposition: A | Discharge status: Home / Self Care | Payer: Self-pay | Attending: Emergency Medicine | Admitting: Emergency Medicine

## 2013-11-28 ENCOUNTER — Emergency Department (HOSPITAL_COMMUNITY): Payer: Self-pay

## 2013-11-28 ENCOUNTER — Encounter (HOSPITAL_COMMUNITY): Payer: Self-pay | Admitting: Emergency Medicine

## 2013-11-28 DIAGNOSIS — Z8742 Personal history of other diseases of the female genital tract: Secondary | ICD-10-CM | POA: Insufficient documentation

## 2013-11-28 DIAGNOSIS — J159 Unspecified bacterial pneumonia: Secondary | ICD-10-CM | POA: Insufficient documentation

## 2013-11-28 DIAGNOSIS — J189 Pneumonia, unspecified organism: Secondary | ICD-10-CM

## 2013-11-28 MED ORDER — LEVOFLOXACIN 500 MG PO TABS: 500.0000 mg | ORAL_TABLET | Freq: Every day | ORAL | Status: DC

## 2013-11-28 MED ORDER — ALBUTEROL SULFATE HFA 108 (90 BASE) MCG/ACT IN AERS
2.0000 | INHALATION_SPRAY | RESPIRATORY_TRACT | Status: DC | PRN
  Administered 2013-11-28: 2 via RESPIRATORY_TRACT
  Filled 2013-11-28: qty 6.7

## 2013-11-28 MED ORDER — HYDROCODONE-ACETAMINOPHEN 7.5-325 MG/15ML PO SOLN: 10.0000 mL | Freq: Four times a day (QID) | ORAL | Status: DC | PRN

## 2013-11-28 MED ORDER — LEVOFLOXACIN 750 MG PO TABS
750.0000 mg | ORAL_TABLET | Freq: Once | ORAL | Status: AC
  Administered 2013-11-28: 750 mg via ORAL
  Filled 2013-11-28: qty 1

## 2013-11-28 NOTE — Discharge Instructions (Signed)
Please take antibiotic as prescribed as treatment for your pneumonia.  Use 2 puff of inhaler every 4 hrs as needed for shortness of breath.  Take hycet for cough.    Neumona en el adulto (Pneumonia, Adult) La neumona es una infeccin en los pulmones.  CAUSAS La causa puede ser un virus o una bacteria. Generalmente estas infecciones se producen por la aspiracin de partculas infecciosas que ingresan a los pulmones (tracto respiratorio). SNTOMAS  Tos.  Grant RutsFiebre.  Dolor en el pecho.  Frecuencia respiratoria aumentada.  Dificultad respiratoria.  Produccin de mucosidad. DIAGNSTICO  Si presenta los sntomas comunes de la neumona, normalmente el mdico confirmar el diagnstico con una radiografa de trax. La radiografa mostrar la anormalidad del pulmn (infiltrados pulmonares) si tiene neumona. Podrn realizarse otras pruebas de Henrysangre, Comor[place] o esputo para encontrar la causa especfica de su neumona. El profesional que lo Designer, industrial/productasiste le realizar pruebas (como gases en sangre o una oximetra de pulso) para Loss adjuster, charteredverificar el correcto funcionamiento de los pulmones. TRATAMIENTO Algunos tipos de neumona pueden contagiarse a otras personas al toser o Engineering geologistestornudar. Es posible que le pidan que utilice una mscara antes y Programmer, applicationsdurante el examen. Si la neumona es causada por una bacteria, puede tratarse con medicamentos antibiticos. Si la neumona es causada por el virus de la influenza, puede tratarse con medicamentos antivirales. La Harley-Davidsonmayora de las dems infecciones virales deben seguir su curso. Estas infecciones no respondern a los antibiticos.  PREVENCIN La vacuna antineumocccica est disponible para prevenir la neumona bacteriana comn. Generalmente se aconseja su aplicacin en:  Personas mayores de 65 aos de edad.  Pacientes que estn en tratamiento de quimioterapia.  Personas con trastornos pulmonares crnicos, como bronquitis o enfisema.  Personas con problemas del sistema  inmunolgico. Si usted es mayor de 65 aos de edad o tiene un trastorno que lo pone en situacin de alto riesgo, es posible que reciba una vacuna antineumoccica. En algunos pases, tambin se recomienda la aplicacin de rutina de la vacuna contra la influenza. Esta vacuna puede ayudar a prevenir algunos casos de neumona.Es posible que le ofrezcan aplicarse la vacuna contra la influenza como parte del Humptulipstratamiento.  Si es fumador, es el momento de abandonar el hbito. Podr recibir instrucciones para facilitarle la decisin de dejar de fumar. Los profesionales pueden proporcionarle medicamentos y asesoramiento para ayudarlo. INSTRUCCIONES PARA EL CUIDADO DOMICILIARIO  Puede utilizar antitusivos si no puede descansar bien; sin embargo, la tos ayuda a Merrill Lynchdespejar los pulmones. Debe evitar utilizar medicamentos para detener la tos, si puede.  Es posible que el mdico le haya prescrito medicamentos si cree que su neumona es causada por una bacteria o por la influenza. Finalice la prescripcin completa, aunque comience a sentirse mejor.  El profesional tambin puede recomendarle o prescribirle un expectorante para aflojar la mucosidad y eliminarla con la tos.  Utilice los medicamentos de venta libre o de prescripcin para Chief Technology Officerel dolor, Environmental health practitionerel malestar o la Bryanfiebre, segn se lo indique el profesional que lo asiste.  No fume. El fumar es una de las causas ms frecuentes de bronquitis y puede contribuir a la neumona. Si es fumador y Conservator, museum/gallerycontina fumando, la tos puede durar varias semanas despus que la neumona haya desaparecido.  Un vaporizador o humidificador con vapor fro en la habitacin o en la casa pueden ayudar a aflojar la mucosidad.  La tos generalmente empeora por la noche. Duerma en posicin semisentada en Loni Dollyuna reposera o use un par de almohadas debajo de la cabeza.  Haga reposo todo el  tiempo que necesite. El Emerson Electricorganismo le har saber cuando tiene que descansar. SOLICITE ATENCIN MDICA DE INMEDIATO  SI:  La enfermedad empeora. Esto vale especialmente en el caso de que usted sea una persona mayor o se encuentre dbil por otra enfermedad.  No puede controlar la tos con medicamentos y no puede dormir debido a Secretary/administratorello.  Comienza a escupir sangre al toser.  Presenta dolor que empeora o no puede controlar con los medicamentos.  Tiene fiebre.  Si alguno de los sntomas que lo trajeron a Stage managerla consulta empeoran en vez de Scientist, clinical (histocompatibility and immunogenetics)mejorar.  Siente falta de aire o Journalist, newspaperdolor en el pecho. EST SEGURO QUE:   Comprende las instrucciones para el alta mdica.  Controlar su enfermedad.  Solicitar atencin mdica de inmediato segn las indicaciones. Document Released: 04/18/2005 Document Revised: 10/01/2011 [facility] Patient Information 2014 [place]ExitCare, [place]LLC.   Emergency Department Resource Guide 1) Find a Doctor and Pay Out of Pocket Although you won't have to find out who is covered by your insurance plan, it is a good idea to ask around and get recommendations. You will then need to call the office and see if the doctor you have chosen will accept you as a new patient and what types of options they offer for patients who are self-pay. Some doctors offer discounts or will set up payment plans for their patients who do not have insurance, but you will need to ask so you aren't surprised when you get to your appointment.  2) Contact Your Local Health Department Not all health departments have doctors that can see patients for sick visits, but many do, so it is worth a call to see if yours does. If you don't know where your local health department is, you can check in your phone book. The CDC also has a tool to help you locate your state's health department, and many state websites also have listings of all of their local health departments.  3) Find a Walk-in Clinic If your illness is not likely to be very severe or complicated, you may want to try a walk in clinic. These are popping up all over the country in  pharmacies, drugstores, and shopping centers. They're usually staffed by nurse practitioners or physician assistants that have been trained to treat common illnesses and complaints. They're usually fairly quick and inexpensive. However, if you have serious medical issues or chronic medical problems, these are probably not your best option.  No Primary Care Doctor: - Call Health Connect at  [phone] - they can help you locate a primary care doctor that  accepts your insurance, provides certain services, etc. - Physician Referral Service- [phone]  Chronic Pain Problems: Organization         Address  Phone   Notes  [facility]  [phone] Patients need to be referred by their primary care doctor.   Medication Assistance: Organization         Address  Phone   Notes  [facility] Medication [facility]istance Program [address] [place], [place] [phone] --Must be a resident of [place] -- Must have NO insurance coverage whatsoever (no Medicaid/ Medicare, etc.) -- The pt. MUST have a primary care doctor that directs their care regularly and follows them in the community   MedAssist  [phone]   [employer]  [phone]    Agencies that provide inexpensive medical care: Retail buyerrganization         Address  Phone  Notes  [place]  [phone]   [facility]    [phone]   [facility] [place], [place] [address] [phone]   [place] [address] [place] [phone]   Planned Parenthood    [phone]   [facility]    [phone]   [place] and [address], [place] Phone:  [phone], Fax:  [phone] Hours of Operation:  9 am - 6 pm, M-F.  Also accepts Medicaid/Medicare and self-pay.  [facility] for [place] [address], [address],  [place] Phone: [phone], Fax: [phone]. Hours of Operation:  8:30 am - 5:30 pm, M-F.  Also accepts Medicaid and self-pay.  [facility] [address], [place] Phone: [phone]   [place], [place], [place] [phone], Ext. [phone] Mondays & Thursdays: 7-9 AM.  First 15 patients are seen on a first come, first serve basis.    [place] Providers:  Organization         Address  Phone   Notes  [facility] [address], [place] [phone] Also accepts self-pay patients.  [facility] [address], [place]  [phone]   [place], [address], [place] [phone]   [facility] [address], [place] [phone]   Lucianne Lei [address], [place]   [phone] Only accepts [place] Access [place] patients after they have their name applied to their card.   Self-Pay (no insurance) in [facility]:  Organization         Address  Phone   Notes  Sickle Cell Patients, [facility] [place] [phone]   [facility] [place] [phone]   [facility] [place]  [place], [place], [place] [phone]   [facility]/Dr. Osei-Bonsu  [address] [place] or [address], [place] [phone] Phone number for both [place] and [place] locations is the same.  Urgent Medical and [facility] [address], [place] [phone]   [facility] [address] [place] or [address] [phone]   [facility] [address] [place] [phone], phone; [phone], fax Sees patients 1st and 3rd Saturday of every month.  Must not  qualify for public or private insurance (i.e. Medicaid, Medicare, [place] Health Choice, Veterans' Benefits)  Household income should be no more than 200% of the poverty level The clinic cannot treat you if you are pregnant or think you are pregnant  Sexually transmitted diseases are not treated at the clinic.    Dental Care: Organization         Address  Phone  Notes  [facility] Department of [facility] [phone] Accepts children up to age 57 who are enrolled in [place] or [place]; pregnant women with a Medicaid card; and children who have applied for Medicaid or [place] Health Choice, but were declined, whose parents can pay a reduced fee at time of service.  [facility]  Department of [facility]  [address], [place] [phone] Accepts children up to age 43 who are enrolled in [place] or [place]; pregnant women with a Medicaid card; and children who have applied for Medicaid or [place] Health Choice, but were declined, whose parents can pay a reduced fee at time of service.  [facility] [facility] Adult Dental Access PROGRAM  [place] [phone] Patients are seen by appointment only. Walk-ins are not accepted. [place] will see patients 58 years of age and older. Monday - Tuesday (8am-5pm) Most Wednesdays (8:30-5pm) $30 per visit, cash only  [facility] Adult Dental Access PROGRAM  [address], [facility] [phone] Patients are seen by appointment only. Walk-ins are not accepted. [place] will see patients 14 years of age and older. One Wednesday Evening (Monthly: Volunteer Based).  $30 per visit, cash only  [place]  [phone] for adults; Children under age 69, call Graduate Pediatric Dentistry at [phone]. Children aged 3-14, please call [phone] to request a pediatric application.  Dental services are provided  in all areas of dental care including fillings, crowns and bridges, complete and partial dentures, implants, gum treatment, root canals, and extractions. Preventive care is also provided. Treatment is provided to both adults and children. Patients are selected via a lottery and there is often a waiting list.   [facility] [address] [place]  [phone] [email or web address]   [facility] [address] [place], [place] [phone], Ext. [phone] Second and Fourth Thursday of each month, opens at 6:30 AM; Clinic ends at 9 AM.  Patients are seen on a first-come first-served basis, and a limited number are seen during each clinic.   [facility]  [address] [place] D[place]er [place], [place] [phone]   Eligibility Requirements You must have lived in [place], [place], or [place] counties for at least the last three months.   You cannot be eligible for state or federal sponsored [employer], including [employer], [place], or [employer].   You generally cannot be eligible for healthcare insurance through your employer.    How to apply: Eligibility screenings are held every Tuesday and Wednesday afternoon from 1:00 pm until 4:00 pm. You do not need an appointment for the interview!  [facility] [address] [place], [place]   [place]  [place] Department  [place]  [phone]    Behavioral Health Resources in the Community: Intensive Outpatient Programs Organization         Address  Phone  Notes  [place]. [address], [place], [place] [phone]   [facility] Outpatient [address], [place], [place]   ADS: Alcohol & Drug Svcs [address], [place], [place]   [place] [address],  [place], [place] or [phone]   Substance Abuse Resources Organization         Address  Phone  Notes  Alcohol and Drug Services  [phone]   [place]  [phone]   [facility] [place]   Chinita Pester  [phone]   Residential & Outpatient Substance Abuse Program  [phone]   Psychological Services Organization         Address  Phone  Notes  [facility]  [place]   [place] [address], [place] or [phone]    Mobile Crisis Teams Organization         Address  Phone  Notes  [facility], Mobile Crisis Care Unit  [phone]   [facility]ive Psychotherapeutic Services  [address] [place], [place]   [place] Levels [address] [place] [phone]    Self-Help/Support Groups Organization         Address  Phone             Notes  [place]. of [place] - variety of support groups  Heath Call for more information  Narcotics Anonymous (NA), [facility] [address], [employer] [place]  2 meetings at this location   Special educational needs teacher         Address  Phone  Notes  ASAP Residential Treatment [place],    [place]  [phone]   [facility]  [address] [place] [address]213, [place], [place]   [place], [place] [phone] Admissions: 8am-3pm M-F  Incentives Substance [place] [address]    [place], [place] [phone]   [facility] [address] [place], [place], [place]   [facility] [address]  [place], [place]   [facility] Programs - Intensive Outpatient [address] Kristeen [address], [place], [place]   [facility] ([place].,  [place], [place] [phone] or  [phone]   [facility] Services (RTS) [address] [employer], [place] Accepts Medicaid  [facility] [address],  [place] [phone] Substance Abuse/Addiction Treatment   [facility] Organization         Address  Phone  Notes  [facility]  [phone]   Domenic Schwab, PhD [address] Arlis [place], [place]   [phone] or [phone]   [place], [place] [phone]   [facility] [address], [place], [place] [phone] Insurance/Medicaid/sponsorship through [facility] and Families [address]                                    [place], [place] [phone] [place] [address], [place] [phone]    Dr. Adele Schilder  [phone]   [facility] [place] Dept. 1) [address], [place] 2) [place] 3)  [place] [address], [place] [phone]  [phone]   [place] [phone] or [phone] (After Hours)

## 2013-11-28 NOTE — ED Provider Notes (Signed)
Medical screening examination/treatment/procedure(s) were performed by non-physician practitioner and as supervising physician I was immediately available for consultation/collaboration.   EKG Interpretation None        William Evita Merida, MD 11/28/13 2240 

## 2013-11-28 NOTE — ED Provider Notes (Signed)
CSN: [id number]     Arrival date & time 11/28/13  1935 History  This chart was scribed for non-physician practitioner, Fayrene HelperBowie Pranish Akhavan, PA-C working with Dagmar HaitWilliam Blair Walden, MD by Greggory StallionKayla Andersen, ED scribe. This patient was seen in room TR06C/TR06C and the patient's care was started at 9:14 PM.   Chief Complaint  Patient presents with  . Cough  . Headache   The history is provided by the patient. No language interpreter was used.   HPI Comments: Sandra Gardner is a 58 y.o. female who presents to the Emergency Department complaining of nasal congestion and headache that started yesterday. States she has had a productive cough of clear and yellow sputum that started about 2 months ago. Pt has taken an OTC decongestant with some relief. Denies fever, chills, SOB, trouble breathing, nausea, emesis, diarrhea. Denies history of smoking cigarettes. Pt's husband was recently sick with similar symptoms. No reported recent hospitalizations.   Past Medical History  Diagnosis Date  . Cystocele   . Urinary incontinence   . Vaginal atrophy    Past Surgical History  Procedure Laterality Date  . Cesarean section    . Cholecystectomy    . Endometrial ablation  2012  . Tubal ligation      BTL-  10-979-  02-1984   History reviewed. No pertinent family history. History  Substance Use Topics  . Smoking status: Never Smoker   . Smokeless tobacco: Never Used  . Alcohol Use: No   OB History   Grav Para Term Preterm Abortions TAB SAB Ect Mult Living   4 3   1 1    3      Review of Systems  Constitutional: Negative for fever and chills.  HENT: Positive for congestion.   Respiratory: Positive for cough. Negative for shortness of breath.   Gastrointestinal: Negative for nausea, vomiting and diarrhea.  Neurological: Positive for headaches.  All other systems reviewed and are negative.  Allergies  Review of patient's allergies indicates no known allergies.  Home Medications   Prior to Admission medications    Medication Sig Start Date End Date Taking? Authorizing Provider  naproxen sodium (ANAPROX) 220 MG tablet Take 220 mg by mouth as needed (pain).    Historical Provider, MD  VITAMIN E PO Take 1 tablet by mouth daily.    Historical Provider, MD   BP 148/73  Pulse 90  Temp(Src) 99.7 F (37.6 C) (Oral)  Resp 18  Ht 4\' 9"  (1.448 m)  Wt 121 lb (54.885 kg)  BMI 26.18 kg/m2  SpO2 96%  Physical Exam  Nursing note and vitals reviewed. Constitutional: She is oriented to person, place, and time. She appears well-developed and well-nourished. No distress.  HENT:  Head: Normocephalic and atraumatic.  Uvula midline. No tonsillar enlargement or exudate.   Eyes: EOM are normal.  Neck: Neck supple. No tracheal deviation present.  Cardiovascular: Normal rate, regular rhythm and normal heart sounds.   Pulmonary/Chest: Effort normal. No respiratory distress. She has wheezes. She has rales.  Inspiratory and expiratory wheezes throughout. Faint rales heard at the base of lungs.   Musculoskeletal: Normal range of motion.  Neurological: She is alert and oriented to person, place, and time.  Skin: Skin is warm and dry.  Psychiatric: She has a normal mood and affect. Her behavior is normal.    ED Course  Procedures (including critical care time)  DIAGNOSTIC STUDIES: Oxygen Saturation is 96% on RA, normal by my interpretation.    COORDINATION OF CARE: 9:17 PM-Discussed treatment  plan which includes an antibiotic, inhaler and cough medication with pt at bedside and pt agreed to plan. Will give pt PCP referrals and advised her to follow up.  Labs Review Labs Reviewed - No data to display  Imaging Review Dg Chest 2 View  11/28/2013   CLINICAL DATA:  Cough and congestion for 2 months.  Fever.  EXAM: CHEST  2 VIEW  COMPARISON:  No comparisons  FINDINGS: Airspace disease is present in the right middle and right lower lobe, compatible with pneumonia. Followup in 4-6 weeks to ensure radiographic clearing  and exclude an underlying lesion is recommended. Cardiopericardial silhouette appears within normal limits. Mediastinal contours are normal. No pleural effusion.  IMPRESSION: Right middle and right lower lobe patchy airspace opacity compatible with pneumonia.   Electronically Signed   By: Andreas NewportGeoffrey  Lamke M.D.   On: 11/28/2013 20:41     EKG Interpretation None      MDM   Final diagnoses:  CAP (community acquired pneumonia)    BP 148/73  Pulse 90  Temp(Src) 99.7 F (37.6 C) (Oral)  Resp 18  Ht 4\' 9"  (1.448 m)  Wt 121 lb (54.885 kg)  BMI 26.18 kg/m2  SpO2 96%  I have reviewed nursing notes and vital signs. I personally reviewed the imaging tests through PACS system  I reviewed available ER/hospitalization records thought the EMR   I personally performed the services described in this documentation, which was scribed in my presence. The recorded information has been reviewed and is accurate.  Fayrene HelperBowie Zhana Jeangilles, PA-C 11/28/13 2121

## 2013-11-28 NOTE — ED Notes (Signed)
Onset 2 months productive cough- clear and sometimes yellow.  Onset yesterday headache.

## 2013-11-28 NOTE — ED Notes (Addendum)
Patient seen here about 2 months ago for the same.  Husband states this should have been gone by now.  +cough, headche, slight fever

## 2014-02-01 ENCOUNTER — Emergency Department (HOSPITAL_COMMUNITY)
Admission: EM | Admit: 2014-02-01 | Discharge: 2014-02-01 | Disposition: A | Discharge status: Home / Self Care | Payer: Self-pay | Attending: Emergency Medicine | Admitting: Emergency Medicine

## 2014-02-01 ENCOUNTER — Encounter (HOSPITAL_COMMUNITY): Payer: Self-pay | Admitting: Emergency Medicine

## 2014-02-01 DIAGNOSIS — B86 Scabies: Secondary | ICD-10-CM | POA: Insufficient documentation

## 2014-02-01 DIAGNOSIS — Z8742 Personal history of other diseases of the female genital tract: Secondary | ICD-10-CM | POA: Insufficient documentation

## 2014-02-01 DIAGNOSIS — Z792 Long term (current) use of antibiotics: Secondary | ICD-10-CM | POA: Insufficient documentation

## 2014-02-01 MED ORDER — DEXAMETHASONE SODIUM PHOSPHATE 10 MG/ML IJ SOLN
10.0000 mg | Freq: Once | INTRAMUSCULAR | Status: AC
  Administered 2014-02-01: 10 mg via INTRAMUSCULAR
  Filled 2014-02-01: qty 1

## 2014-02-01 MED ORDER — PERMETHRIN 5 % EX CREA: TOPICAL_CREAM | CUTANEOUS | Status: DC

## 2014-02-01 NOTE — ED Notes (Signed)
Pt has small red insect bites to bilateral arms and legs since Saturday. Denies pain but reports itching. No scabs noted. Denies fever/chills. NAD.

## 2014-02-01 NOTE — Discharge Instructions (Signed)
Escabiosis (Scabies) La escabiosis son pequeos parsitos (caros) que horadan la piel y causan protuberancias rojas y picazn. Estos parsitos slo pueden verse en el microscopio. Son muy contagiosos. Se diseminan fcilmente de una persona a otra por contacto directo. Tambin el contagio se produce al compartir prendas de vestir o ropa de cama. No es infrecuente que una familia entera se infecte al compartir toallas, prendas de vestir o ropa de cama.  INSTRUCCIONES PARA EL CUIDADO DOMICILIARIO  El profesional que lo asiste podr prescribirle alguna crema o locin para eliminar los caros. Si se le prescribe, masajee la crema en cada centmetro cuadrado de piel, desde el cuello hasta las plantas de los pies. Tambin aplique la crema en el cuero cabelludo y rostro si se trata de un nio de menos de 1 ao. Evite aplicarla en los ojos y en la boca. No se lave las manos despus de la aplicacin.  Djela durante 8 a 12 horas. El nio podr baarse o darse una ducha despus de 8 a 12 horas de la aplicacin. A veces es til aplicar la crema justo antes de la hora de dormir.  Generalmente un tratamiento es suficiente y eliminar aproximadamente el 95% de las infecciones. El los casos graves se indicar repetir el tratamiento luego de 1 semana. Todas las personas que habitan en la misma casa deben tratarse con una aplicacin de la crema.  No debern aparecer nuevas erupciones ni galeras luego de las 24 a 48 horas del tratamiento; sin embargo la picazn podra durar de 2 a 4 semanas despus del tratamiento. ste podr tambin prescribirle un medicamento para ayudarle con la picazn o hacer que desaparezca ms rpidamente.  Estos parsitos pueden vivir en la ropa hasta 3 das. Lave con agua caliente y seque a temperatura elevada durante 20 minutos todas las prendas, toallas, peluches y ropa de cama que el nio haya usado recientemente. Las prendas que no pueden lavarse, debern ser colocadas en una bolsa plstica  durante al menos 3 das.  Para aliviar la picazn, dele al nio en un bao de agua fra o aplique paos fros en las zonas afectadas.  El nio podr regresar a la escuela despus del tratamiento con la crema prescripta. SOLICITE ANTENCIN MDICA SI:  La picazn persiste durante ms de 4 semanas despus del tratamiento.  La erupcin se disemina o se infecta. Los signos de infeccin son ampollas rojas o costras de color marrn amarillento. Document Released: 04/18/2005 Document Revised: 10/01/2011 [employer] Patient Information 2015 [employer]. This information is not intended to replace advice given to you by your health care provider. Make sure you discuss any questions you have with your health care provider.  

## 2014-02-01 NOTE — ED Notes (Signed)
Pt states room was cleaned Saturday after "blood sucking bugs found" in bed; pt's husband has no rash. Pt states she took benadryl and it is better but still very itchy.

## 2014-02-01 NOTE — ED Provider Notes (Signed)
CSN: [id number]     Arrival date & time 02/01/14  1901 History  This chart was scribed for non-physician practitioner, Roxy Horsemanobert Jaasiel Hollyfield, PA-C working with Derwood KaplanAnkit Nanavati, MD by Greggory StallionKayla Andersen, ED scribe. This patient was seen in room TR04C/TR04C and the patient's care was started at 8:09 PM.   Chief Complaint  Patient presents with  . Insect Bite   The history is provided by the patient. No language interpreter was used.   HPI Comments: Sandra AndreasMaria G Gardner is a 58 y.o. female who presents to the Emergency Department complaining of small, red, itchy spots to bilateral arms and legs that started 2 days ago. Denies any pain. She is unsure if they are insect bites. States she has not slept anywhere new recently. Pt has taken benadryl and used hydrocortisone with some relief but states symptoms comes back soon after. No fevers or chills.  Past Medical History  Diagnosis Date  . Cystocele   . Urinary incontinence   . Vaginal atrophy    Past Surgical History  Procedure Laterality Date  . Cesarean section    . Cholecystectomy    . Endometrial ablation  2012  . Tubal ligation      BTL-  10-979-  02-1984   No family history on file. History  Substance Use Topics  . Smoking status: Never Smoker   . Smokeless tobacco: Never Used  . Alcohol Use: No   OB History   Grav Para Term Preterm Abortions TAB SAB Ect Mult Living   4 3   1 1    3      Review of Systems  Constitutional: Negative for fever.  HENT: Negative for congestion.   Eyes: Negative for redness.  Respiratory: Negative for shortness of breath.   Cardiovascular: Negative for chest pain.  Gastrointestinal: Negative for abdominal distention.  Musculoskeletal: Negative for gait problem.  Skin: Positive for rash.  Neurological: Negative for speech difficulty.  Psychiatric/Behavioral: Negative for confusion.   Allergies  Review of patient's allergies indicates no known allergies.  Home Medications   Prior to Admission medications    Medication Sig Start Date End Date Taking? Authorizing Provider  HYDROcodone-acetaminophen (HYCET) 7.5-325 mg/15 ml solution Take 10 mLs by mouth 4 (four) times daily as needed for moderate pain. 11/28/13   Fayrene HelperBowie Tran, PA-C  levofloxacin (LEVAQUIN) 500 MG tablet Take 1 tablet (500 mg total) by mouth daily. 11/28/13   Fayrene HelperBowie Tran, PA-C  naproxen sodium (ANAPROX) 220 MG tablet Take 220 mg by mouth as needed (pain).    Historical Provider, MD  VITAMIN E PO Take 1 tablet by mouth daily.    Historical Provider, MD   BP 155/88  Pulse 68  Temp(Src) 98.3 F (36.8 C) (Oral)  Resp 18  SpO2 100%  Physical Exam  Nursing note and vitals reviewed. Constitutional: She is oriented to person, place, and time. She appears well-developed and well-nourished. No distress.  HENT:  Head: Normocephalic and atraumatic.  Eyes: Conjunctivae and EOM are normal.  Cardiovascular: Normal rate and regular rhythm.   Pulmonary/Chest: Effort normal and breath sounds normal. No stridor. No respiratory distress.  Abdominal: She exhibits no distension.  Musculoskeletal: She exhibits no edema.  Neurological: She is alert and oriented to person, place, and time. No cranial nerve deficit.  Skin: Skin is warm and dry.  Scattered bug bites to upper and lower extremities. No evidence of cellulitis.   Psychiatric: She has a normal mood and affect.    ED Course  Procedures (including critical care time)  DIAGNOSTIC STUDIES: Oxygen Saturation is 100% on RA, normal by my interpretation.    COORDINATION OF CARE: 8:12 PM-Discussed treatment plan which includes prometherin cream and decadron with pt at bedside and pt agreed to plan.   Labs Review Labs Reviewed - No data to display  Imaging Review No results found.   EKG Interpretation None      MDM   Final diagnoses:  Scabies    Discussed diagnosis & treatment of scabies with patient and/or parent/guardian.  They have been advised to followup with her primary care  doctor 2 weeks after treatment.  They have also been advised to clean entire household, including washing sheets in warm water.   The use of permethrin cream was discussed as well, they were told to use cream from the neck down & leave on for 8-12 hours.  They've been advised to repeat treatment in one week if new eruptions occur. Patient/parent/guardian verbalized understanding.     I personally performed the services described in this documentation, which was scribed in my presence. The recorded information has been reviewed and is accurate.  Roxy Horseman, PA-C 02/01/14 2022

## 2014-02-01 NOTE — ED Notes (Signed)
Pt to wait 30 minutes to be discharged.

## 2014-02-02 NOTE — ED Provider Notes (Signed)
Medical screening examination/treatment/procedure(s) were performed by non-physician practitioner and as supervising physician I was immediately available for consultation/collaboration.   EKG Interpretation None       Siara Gorder, MD 02/02/14 0126 

## 2014-02-13 ENCOUNTER — Encounter (HOSPITAL_COMMUNITY): Payer: Self-pay | Admitting: Emergency Medicine

## 2014-02-13 ENCOUNTER — Emergency Department (HOSPITAL_COMMUNITY)
Admission: EM | Admit: 2014-02-13 | Discharge: 2014-02-13 | Disposition: A | Discharge status: Home / Self Care | Payer: Self-pay | Attending: Emergency Medicine | Admitting: Emergency Medicine

## 2014-02-13 DIAGNOSIS — W57XXXA Bitten or stung by nonvenomous insect and other nonvenomous arthropods, initial encounter: Secondary | ICD-10-CM | POA: Insufficient documentation

## 2014-02-13 DIAGNOSIS — Y939 Activity, unspecified: Secondary | ICD-10-CM | POA: Insufficient documentation

## 2014-02-13 DIAGNOSIS — Z8742 Personal history of other diseases of the female genital tract: Secondary | ICD-10-CM | POA: Insufficient documentation

## 2014-02-13 DIAGNOSIS — S90569A Insect bite (nonvenomous), unspecified ankle, initial encounter: Secondary | ICD-10-CM | POA: Insufficient documentation

## 2014-02-13 DIAGNOSIS — S30860A Insect bite (nonvenomous) of lower back and pelvis, initial encounter: Secondary | ICD-10-CM | POA: Insufficient documentation

## 2014-02-13 DIAGNOSIS — IMO0001 Reserved for inherently not codable concepts without codable children: Secondary | ICD-10-CM | POA: Insufficient documentation

## 2014-02-13 DIAGNOSIS — Y929 Unspecified place or not applicable: Secondary | ICD-10-CM | POA: Insufficient documentation

## 2014-02-13 MED ORDER — HYDROCORTISONE 1 % EX CREA: TOPICAL_CREAM | CUTANEOUS | Status: DC

## 2014-02-13 MED ORDER — HYDROXYZINE HCL 25 MG PO TABS: 25.0000 mg | ORAL_TABLET | Freq: Four times a day (QID) | ORAL | Status: DC

## 2014-02-13 NOTE — ED Provider Notes (Signed)
CSN: [id number]     Arrival date & time 02/13/14  1549 History  This chart was scribed for non-physician practitioner working with Rolan Bucco, MD by Elveria Rising, ED Scribe. This patient was seen in room TR08C/TR08C and the patient's care was started at 4:21 PM.   Chief Complaint  Patient presents with  . Insect Bite    scabies      The history is provided by the patient. No language interpreter was used.   HPI Comments: Sandra Gardner is a 58 y.o. female who presents to the Emergency Department complaining of recurring, itchy, red bug bites that cover her body. Patient reports that the she noticed the bug bites two weeks ago. At her previous ED visit 02/01/14, she was treated for the same and received permethrin cream. Patient reports using the prescribed cream as directed, without relief.  Patient thorough cleaning, but she has not been able to eradicate the infestation. Patient's husband does not have similar bites or complaints.   Past Medical History  Diagnosis Date  . Cystocele   . Urinary incontinence   . Vaginal atrophy    Past Surgical History  Procedure Laterality Date  . Cesarean section    . Cholecystectomy    . Endometrial ablation  2012  . Tubal ligation      BTL-  10-979   History reviewed. No pertinent family history. History  Substance Use Topics  . Smoking status: Never Smoker   . Smokeless tobacco: Never Used  . Alcohol Use: No   OB History   Grav Para Term Preterm Abortions TAB SAB Ect Mult Living   4 3   1 1    3      Review of Systems  Constitutional: Negative for fever and chills.  Gastrointestinal: Negative for nausea, vomiting and diarrhea.  Skin: Positive for rash.      Allergies  Review of patient's allergies indicates no known allergies.  Home Medications   Prior to Admission medications   Medication Sig Start Date End Date Taking? Authorizing Provider  HYDROcodone-acetaminophen (HYCET) 7.5-325 mg/15 ml solution Take 10 mLs by  mouth 4 (four) times daily as needed for moderate pain. 11/28/13   Fayrene Helper, PA-C  levofloxacin (LEVAQUIN) 500 MG tablet Take 1 tablet (500 mg total) by mouth daily. 11/28/13   Fayrene Helper, PA-C  naproxen sodium (ANAPROX) 220 MG tablet Take 220 mg by mouth as needed (pain).    Historical Provider, MD  permethrin (ELIMITE) 5 % cream Apply to entire body other than face - let sit for 14 hours then wash off, may repeat in 1 week if still having symptoms 02/01/14   Roxy Horseman, PA-C  VITAMIN E PO Take 1 tablet by mouth daily.    Historical Provider, MD   Triage Vitals: BP 125/63  Pulse 58  Temp(Src) 97.3 F (36.3 C) (Oral)  Resp 16  SpO2 100% Physical Exam  Nursing note and vitals reviewed. Constitutional: She is oriented to person, place, and time. She appears well-developed and well-nourished. No distress.  HENT:  Head: Normocephalic and atraumatic.  Eyes: EOM are normal.  Neck: Neck supple. No tracheal deviation present.  Cardiovascular: Normal rate.   Pulmonary/Chest: Effort normal. No respiratory distress.  Musculoskeletal: Normal range of motion.  Neurological: She is alert and oriented to person, place, and time.  Skin: Skin is warm and dry. Rash noted. There is erythema.  Erythematous raised papular rash to bilateral arms, chest, abdomen, thighs  Psychiatric: She has a normal  mood and affect. Her behavior is normal.    ED Course  Procedures (including critical care time) COORDINATION OF CARE: 4:27 PM- Discussed treatment plan with patient at bedside and patient agreed to plan.    Labs Review Labs Reviewed - No data to display  Imaging Review No results found.   EKG Interpretation None      MDM   Final diagnoses:  Insect bites    Patient rash consistent with insect bites. She states it's not mosquitoes. Patient's husband had rash to which now seemed to subside. Rash most likely consistent with bedbugs. Patient's husband states he has seen bugs in the house and  said that when he killed him he has no blood. I have instructed patient to exterminate the house, get red of the mattress, wash all sheets and clothing in hot water. The Benadryl or Vistaril for itching. Hydrocortisone cream and Benadryl cream for itching as well. Followup with primary care Dr.  Ceasar MonsFiled Vitals:   02/13/14 1617  BP: 125/63  Pulse: 58  Temp: 97.3 F (36.3 C)  Resp: 16     I personally performed the services described in this documentation, which was scribed in my presence. The recorded information has been reviewed and is accurate.    Lottie Musselatyana A Joetta Delprado, PA-C 02/13/14 1639

## 2014-02-13 NOTE — ED Notes (Signed)
Declined W/C at D/C and was escorted to lobby by RN. 

## 2014-02-13 NOTE — ED Notes (Signed)
Pt reports she still has bug bites all over body.

## 2014-02-13 NOTE — Discharge Instructions (Signed)
See print out provided. Vistaril for itching as needed. Hydrocortisone cream on the bites. You can also try benadryl cream and calimine lotion. Follow up with primary care doctor.    Chinches (Bedbugs)  Las chinches son insectos pequeos que viven en y alrededor de las camas. Durante el da se ocultan en colchones y otros lugares cerca de las camas. Por la noche salen y pican a los que duermen. Necesitan sangre para vivir y Engineer, building servicesdesarrollarse. Pueden encontrarse en las camas de Corporate treasurercualquier lugar. Por lo general, se encuentran en lugares donde muchas personas Zenaida Niecevan y vienen (hoteles, albergues, hospitales). No importa si el lugar est sucio o limpio.  La picadura de chinches rara vez causa problemas de Castanasalud. El mayor problema es deshacerse de ellos.  Generalmente requiere la intervencin de un experto en control de plagas. CAUSAS   Menos uso de pesticidas. Las chinches eran una plaga comn antes de la dcada de 1950. En ese entonces, los pesticidas fuertes, como el DDT estuvo a punto eliminarlas. Actualmente ya no se Lao People's Democratic Republicutilizan plaguicidas, ya que daan [address] medio ambiente y pueden causar problemas de [place]alud.  Se viaja ms. Adems de los colchones, las chinches tambin pueden vivir en la ropa y el equipaje. Pueden venir con las personas que viajan de Administratorun lugar a Therapist, artotro. Las chinches son ms comunes en ciertas partes del mundo. Cuando la gente viaja a esas zonas, los insectos pueden volver a casa con ellos.  Presencia de aves y Regulatory affairs officermurcilagos. Las chinches suelen infestar a las aves y Sales executivelos murcilagos. Si usted tiene Xcel Energyestos animales en los alrededores de su casa, las chinches pueden infestar su casa, [place] Eng[place]htambin. SNTOMAS  La picadura de una chinche no duele. Probablemente no lo despierte. Las chinches pican las reas de la piel descubiertas. Los sntomas pueden aparecer al despertar, o despus de uno o ms das. Los sntomas pueden ser:   Pequeos bultos rojos en la piel. Estos pueden ser The Interpublic Group of Companiesalineados en una fila o  agrupado.  Un punto rojo ms oscuro en el centro de protuberancias rojas.  Ampollas en la piel. Puede haber inflamacin y picazn muy intensa. Estos pueden ser sntomas de Runner, broadcasting/film/videouna reaccin alrgica. Esto no sucede a menudo. DIAGNSTICO  Las picaduras de chinches puede verse y sentirse como otros tipos de picaduras de insectos. Los insectos no se quedan adheridos al cuerpo, Avon Productscomo las garrapatas o piojos. Muerden, se caen, y se arrastran a esconderse. Su mdico probablemente har lo siguiente:   Economistreguntar acerca de sus sntomas.  Preguntar sobre sus actividades recientes y los viajes.  Reviser en su piel las picaduras.  Le pedir que investigue en su casa en busca de signos de chinches. Usted debe buscar:  Manchas o puntos en la cama o en los alrededores. Pueden ser las chinches aplastadas, sus huevos o los desechos.  Las Boeingchinches mismas. Son de color marrn rojizo, forma ovalada y plana. No vuelan Son aproximadamente del [address] Hospital Drivetamao de [place] Fasouna semilla de [place]manzana.  Los lugares para buscar chinches son:  Camas. Revise colchones, cabeceras, somiers y Affiliated Computer Servicesmarcos de la cama.  Cortinados en los alrededores de la cama.  Debajo de la alfombra del dormitorio.  Detrs de los BJ'senchufes elctricos.  Detrs del papel pintado que se est despegando.  Dentro de equipaje. TRATAMIENTO  La mayora de las picaduras no necesitan antibiticos. Desaparecen sin tratamiento luego de Hartford Financialpocos das. Las picaduras no son peligrosas. Sin embargo, puede ser necesario un tratamiento si usted se ha rascado tanto que su piel se ha infectado. Tambin puede necesitar  un tratamiento si es alrgico a las picaduras de las chinches. Las opciones de tratamiento son:   Un medicamento que detiene la inflamacin y Higher education careers adviser (corticoides). Por lo general, se frota una crema sobre la piel. Si usted tiene una erupcin grave, podrn recetarle una pastilla de corticooides.  Antihistamnicos por va oral. Estas pastillas ayudan a controlar la  picazn.  Antibiticos. Si la piel se infecta le recetarn antibiticos. INSTRUCCIONES PARA EL CUIDADO EN EL HOGAR   Tome los medicamentos como le indic el mdico. Siga cuidadosamente las indicaciones.  Use pijamas con mangas y piernas largas.  Podra ser necesario un tratamiento en la habitacin. Un experto en plagas deber verificar que las chinches han desaparecido. Puede ser que deba desechar el colchn. Consulte con un experto qu puede hacer para prevenir que vuelva la plaga. Entre las sugerencias ms comunes se incluyen:  Coloque una cubierta plstica sobre el colchn.  Lave y seque sus ropas y ropa de cama en agua caliente y en secador con calor. La temperatura debe ser de ms de 120 F (48.9 C). Las altas [employer] las chinches.  Pase la aspiradora cuidadosamente alrededor [employer]. Aspire en todas las grietas y escondites en que las chinches puedan ocultarse. Hgalo con frecuencia.  Controle todos los [place], camas o ropa de cama usados antes de llevarlos a su casa.  Elimine los nidos de pjaros y perchas de los murcilagos.  Si las chinches lo pican durante un viaje controle todas sus pertenencias con cuidados antes de llevarlas a su casa. Si encuentra chinches en la ropa o en el equipaje, considere la posibilidad de desecharlos. SOLICITE ATENCIN MDICA SI:   Tiene picaduras rojas que vuelven a aparecer.  Siente una picazn intensa en las ronchas.  Las picaduras le causan una erupcin en la piel.  Tiene marcas de rascado que estn rojas y Secretary/administrator. SOLICITE ATENCIN MDICA DE INMEDIATO SI:  Tiene fiebre.  Document Released: 03/21/2011 Document Revised: 10/01/2011 [facility] Patient Information 2015 [place], [place]. This information is not intended to replace advice given to you by your health care provider. Make sure you discuss any questions you have with your health care provider.

## 2014-02-14 NOTE — ED Provider Notes (Signed)
Medical screening examination/treatment/procedure(s) were performed by non-physician practitioner and as supervising physician I was immediately available for consultation/collaboration.   EKG Interpretation None        Oshen Wlodarczyk, MD 02/14/14 0008 

## 2014-05-24 ENCOUNTER — Encounter (HOSPITAL_COMMUNITY): Payer: Self-pay | Admitting: Emergency Medicine

## 2015-01-14 ENCOUNTER — Other Ambulatory Visit: Payer: Self-pay | Admitting: Obstetrics & Gynecology

## 2015-01-14 DIAGNOSIS — Z1231 Encounter for screening mammogram for malignant neoplasm of breast: Secondary | ICD-10-CM

## 2015-01-19 ENCOUNTER — Ambulatory Visit (HOSPITAL_COMMUNITY)
Admission: RE | Admit: 2015-01-19 | Discharge: 2015-01-19 | Disposition: A | Discharge status: Home / Self Care | Payer: Self-pay | Source: Ambulatory Visit | Attending: Obstetrics & Gynecology | Admitting: Obstetrics & Gynecology

## 2015-01-19 DIAGNOSIS — Z1231 Encounter for screening mammogram for malignant neoplasm of breast: Secondary | ICD-10-CM

## 2015-09-03 ENCOUNTER — Encounter (HOSPITAL_COMMUNITY): Payer: Self-pay | Admitting: Emergency Medicine

## 2015-09-03 ENCOUNTER — Emergency Department (HOSPITAL_COMMUNITY)
Admission: EM | Admit: 2015-09-03 | Discharge: 2015-09-03 | Disposition: A | Discharge status: Home / Self Care | Payer: Self-pay | Attending: Emergency Medicine | Admitting: Emergency Medicine

## 2015-09-03 DIAGNOSIS — Z7952 Long term (current) use of systemic steroids: Secondary | ICD-10-CM | POA: Insufficient documentation

## 2015-09-03 DIAGNOSIS — J069 Acute upper respiratory infection, unspecified: Secondary | ICD-10-CM | POA: Insufficient documentation

## 2015-09-03 DIAGNOSIS — Z792 Long term (current) use of antibiotics: Secondary | ICD-10-CM | POA: Insufficient documentation

## 2015-09-03 DIAGNOSIS — Z8742 Personal history of other diseases of the female genital tract: Secondary | ICD-10-CM | POA: Insufficient documentation

## 2015-09-03 DIAGNOSIS — Z79899 Other long term (current) drug therapy: Secondary | ICD-10-CM | POA: Insufficient documentation

## 2015-09-03 MED ORDER — IBUPROFEN 400 MG PO TABS
800.0000 mg | ORAL_TABLET | Freq: Once | ORAL | Status: AC
  Administered 2015-09-03: 800 mg via ORAL
  Filled 2015-09-03: qty 2

## 2015-09-03 MED ORDER — BENZONATATE 100 MG PO CAPS: 100.0000 mg | ORAL_CAPSULE | Freq: Three times a day (TID) | ORAL | Status: DC

## 2015-09-03 MED ORDER — BENZONATATE 100 MG PO CAPS
100.0000 mg | ORAL_CAPSULE | Freq: Once | ORAL | Status: AC
  Administered 2015-09-03: 100 mg via ORAL
  Filled 2015-09-03: qty 1

## 2015-09-03 NOTE — ED Provider Notes (Signed)
History  By signing my name below, I, Karle Plumber, attest that this documentation has been prepared under the direction and in the presence of Lane Hacker, PA-C. Electronically Signed: Karle Plumber, ED Scribe. 09/03/2015. 4:32 PM.  Chief Complaint  Patient presents with  . Cough   HPI  HPI Comments:  Sandra Gardner is a 60 y.o. female who presents to the Emergency Department complaining of a intermittently productive cough of phlegm that began 2-3 days ago. She reports associated subjective fever, sore throat and generalized body aches. She reports taking TheraFlu with minimal relief of the symptoms. She denies modifying factors. Pt reports sick contacts stating her husband and son have been sick with similar symptoms. She denies SOB, abdominal pain, otalgia.  Past Medical History  Diagnosis Date  . Cystocele   . Urinary incontinence   . Vaginal atrophy    Past Surgical History  Procedure Laterality Date  . Cesarean section    . Cholecystectomy    . Endometrial ablation  2012  . Tubal ligation      BTL-  07-6107   No family history on file. Social History  Substance Use Topics  . Smoking status: Never Smoker   . Smokeless tobacco: Never Used  . Alcohol Use: No   OB History    Gravida Para Term Preterm AB TAB SAB Ectopic Multiple Living   Review of Systems  Ten systems are reviewed and are negative for acute change except as noted in the HPI  Allergies  Review of patient's allergies indicates no known allergies.  Home Medications   Prior to Admission medications   Medication Sig Start Date End Date Taking? Authorizing Provider  HYDROcodone-acetaminophen (HYCET) 7.5-325 mg/15 ml solution Take 10 mLs by mouth 4 (four) times daily as needed for moderate pain. 11/28/13   Fayrene Helper, PA-C  hydrocortisone cream 1 % Apply to affected area 2 times daily 02/13/14   Tatyana Kirichenko, PA-C  hydrOXYzine (ATARAX/VISTARIL) 25 MG tablet Take 1 tablet  (25 mg total) by mouth every 6 (six) hours. 02/13/14   Tatyana Kirichenko, PA-C  levofloxacin (LEVAQUIN) 500 MG tablet Take 1 tablet (500 mg total) by mouth daily. 11/28/13   Fayrene Helper, PA-C  naproxen sodium (ANAPROX) 220 MG tablet Take 220 mg by mouth as needed (pain).    Historical Provider, MD  permethrin (ELIMITE) 5 % cream Apply to entire body other than face - let sit for 14 hours then wash off, may repeat in 1 week if still having symptoms 02/01/14   Roxy Horseman, PA-C  VITAMIN E PO Take 1 tablet by mouth daily.    Historical Provider, MD   Triage Vitals: BP 116/77 mmHg  Pulse 74  Temp(Src) 98.6 F (37 C) (Oral)  Resp 20  Ht  (1.448 m)  Wt 124 lb (56.246 kg)  BMI 26.83 kg/m2  SpO2 100% Physical Exam  Constitutional: She is oriented to person, place, and time. She appears well-developed and well-nourished.  HENT:  Head: Normocephalic and atraumatic.  Right Ear: Tympanic membrane normal. No drainage. Tympanic membrane is not bulging.  Left Ear: Tympanic membrane normal. No drainage. Tympanic membrane is not bulging.  Mouth/Throat: Uvula is midline, oropharynx is clear and moist and mucous membranes are normal.  3+ tonsillar edema. Tonsillith bilaterally. Mild erythema of tonsils bilaterally.  Eyes: EOM are normal.  Neck: Normal range of motion.  Cardiovascular: Normal rate, regular rhythm and normal  heart sounds.  Exam reveals no gallop and no friction rub.   No murmur heard. Pulmonary/Chest: Effort normal and breath sounds normal. No respiratory distress. She has no wheezes. She has no rales.  Musculoskeletal: Normal range of motion.  Lymphadenopathy:    Cervical adenopathy: tender.  Neurological: She is alert and oriented to person, place, and time.  Skin: Skin is warm and dry.  Psychiatric: She has a normal mood and affect. Her behavior is normal.  Nursing note and vitals reviewed.   ED Course  Procedures  DIAGNOSTIC STUDIES: Oxygen Saturation is 100% on RA,  normal by my interpretation.   COORDINATION OF CARE: 4:25 PM- Will prescribe Ibuprofen and Tessalon Perles. Pt verbalizes understanding and agrees to plan.   MDM   Final diagnoses:  Upper respiratory infection   Patient non-toxic appearing and VSS. Centor criteria advises against testing or abx. Patients symptoms are consistent with URI, likely viral etiology. Discussed that antibiotics are not indicated for viral infections.   Medications  ibuprofen (ADVIL,MOTRIN) tablet 800 mg (800 mg Oral Given 09/03/15 1641)  benzonatate (TESSALON) capsule 100 mg (100 mg Oral Given 09/03/15 1641)   Patient feels improved after observation and/or treatment in ED.  Patient may be safely discharged home with  Discharge Medication List as of 09/03/2015  4:35 PM    START taking these medications   Details  benzonatate (TESSALON) 100 MG capsule Take 1 capsule (100 mg total) by mouth every 8 (eight) hours., Starting 09/03/2015, Until Discontinued, Print        Discussed reasons for return. Patient to follow-up with primary care provider within one week. Patient in understanding and agreement with the plan.  I personally performed the services described in this documentation, which was scribed in my presence. The recorded information has been reviewed and is accurate.   Melton Krebs, PA-C 09/05/15 0151  Vanetta Mulders, MD 09/07/15 [phone]

## 2015-09-03 NOTE — ED Notes (Signed)
Declined W/C at D/C and was escorted to lobby by RN. 

## 2015-09-03 NOTE — Discharge Instructions (Signed)
[name],  Nice meeting you! Please follow-up with your primary care provider. Return to the emergency department if you develop shortness of breath, chest pain. Feel better soon!  [name], PA-C Infeccin del tracto respiratorio superior, adultos (Upper Respiratory Infection, Adult) La mayora de las infecciones del tracto respiratorio superior son infecciones virales de las vas que llevan el aire a los pulmones. Un infeccin del tracto respiratorio superior afecta la nariz, la garganta y las vas respiratorias superiores. El tipo ms frecuente de infeccin del tracto respiratorio superior es la nasofaringitis, que habitualmente se conoce como "resfro comn". Las infecciones del tracto respiratorio superior siguen su curso y por lo general se curan solas. En la [employer], la infeccin del tracto respiratorio superior no requiere atencin [place], Biomedical engineer a veces, despus de una infeccin viral, puede surgir una infeccin bacteriana en las vas respiratorias superiores. Esto se conoce como infeccin secundaria. Las infecciones sinusales y en el odo medio son tipos frecuentes de infecciones secundarias en el tracto respiratorio superior. La neumona bacteriana tambin puede complicar un cuadro de infeccin del tracto respiratorio superior. Este tipo de infeccin puede empeorar el asma y la enfermedad pulmonar obstructiva crnica (EPOC). En algunos casos, estas complicaciones pueden requerir atencin mdica de emergencia y poner en peligro la vida.  CAUSAS Casi todas las infecciones del tracto respiratorio superior se deben a los virus. Un virus es un tipo de microbio que puede contagiarse de Neomia Dear persona a Educational psychologist.  FACTORES DE RIESGO Puede estar en riesgo de sufrir una infeccin del tracto respiratorio superior si:   Fuma.  Tiene una enfermedad pulmonar o cardaca crnica.  Tiene debilitado el sistema de defensa (inmunitario) del cuerpo.  Es [address] o de edad muy  [place].  Tiene asma o alergias nasales.  Trabaja en reas donde hay mucha gente o poca ventilacin.  Rudi Coco en una escuela o en un centro de atencin mdica. SIGNOS Y SNTOMAS  Habitualmente, los sntomas aparecen de 2a 3das despus de entrar en contacto con el virus del resfro. La mayora de las infecciones virales en el tracto respiratorio superior duran de 7a 10das. Sin embargo, las infecciones virales en el tracto respiratorio superior a causa del virus de la gripe pueden durar de 14a 18das y, habitualmente, son ms graves. Entre los sntomas se pueden incluir los siguientes:   Secrecin o congestin nasal.  Estornudos.  Tos.  Dolor de Advertising copywriter.  Dolor de [place].  Fatiga.  Grant Ruts.  Prdida del apetito.  Dolor en la frente, detrs de los ojos y por encima de los pmulos (dolor sinusal).  Dolores musculares. DIAGNSTICO  El mdico puede diagnosticar una infeccin del tracto respiratorio superior mediante los siguientes estudios:  Examen fsico.  Pruebas para verificar si los sntomas no se deben a otra afeccin, por ejemplo:  Faringitis estreptoccica.  Sinusitis.  Neumona.  Asma. TRATAMIENTO  Esta infeccin desaparece sola, con el tiempo. No puede curarse con medicamentos, pero a menudo se prescriben para aliviar los sntomas. Los medicamentos pueden ser tiles para lo siguiente:  Personal assistant fiebre.  Reducir la tos.  Aliviar la congestin nasal. INSTRUCCIONES PARA EL CUIDADO EN EL HOGAR   Tome los medicamentos solamente como se lo haya indicado el mdico.  A fin de Engineer, materials de garganta, haga grgaras con solucin salina templada o consuma caramelos para la tos, como se lo haya indicado el mdico.  Use un humidificador de vapor clido o inhale el vapor de la ducha para aumentar  la humedad del aire. Esto facilitar la respiracin.  Beba suficiente lquido para Photographer orina clara o de color amarillo plido.  Consuma sopas y otros  caldos transparentes, y [employer].  Descanse todo lo que sea necesario.  Regrese al Aleen Campi cuando la temperatura se le haya normalizado o cuando el mdico lo autorice. Es posible que deba quedarse en su casa durante un tiempo prolongado, para no infectar a los dems. Tambin puede usar un barbijo y lavarse las manos con cuidado para Transport planner propagacin del virus.  Aumente el uso del inhalador si tiene asma.  No consuma ningn producto que contenga tabaco, lo que incluye cigarrillos, tabaco de Theatre manager o Administrator, Civil Service. Si necesita ayuda para dejar de fumar, consulte al [employer]. PREVENCIN  La mejor manera de protegerse de un resfro es mantener una higiene [place].   Evite el contacto oral o fsico con personas que tengan sntomas de resfro.  En caso de contacto, lvese las manos con frecuencia. No hay pruebas claras de que la vitaminaC, la vitaminaE, la equincea o el ejercicio reduzcan la probabilidad de Primary school teacher un resfro. Sin embargo, siempre se recomienda Insurance account manager, hacer ejercicio y Engineering geologist.  SOLICITE ATENCIN MDICA SI:   Su estado empeora en lugar de mejorar.  Los medicamentos no Estate agent.  Tiene escalofros.  La sensacin de falta de aire empeora.  Tiene mucosidad marrn o roja.  Tiene secrecin nasal amarilla o marrn.  Le duele la cara, especialmente al inclinarse hacia adelante.  Tiene fiebre.  Tiene los ganglios del cuello hinchados.  Siente dolor al tragar.  Tiene zonas blancas en la parte de atrs de la garganta. SOLICITE ATENCIN MDICA DE INMEDIATO SI:   Tiene sntomas intensos o persistentes de:  Dolor de [place].  Dolor de odos.  Dolor sinusal.  Dolor en el pecho.  Tiene enfermedad pulmonar crnica y cualquiera de estos sntomas:  Sibilancias.  Tos prolongada.  Tos con sangre.  Cambio en la mucosidad habitual.  Presenta rigidez en el cuello.  Tiene cambios en:  La visin.  La  audicin.  El pensamiento.  El [place] de nimo. ASEGRESE DE QUE:   Comprende estas instrucciones.  Controlar su afeccin.  Recibir ayuda de inmediato si no mejora o si empeora.   Esta informacin no tiene Theme park manager el consejo del mdico. Asegrese de hacerle al mdico cualquier pregunta que tenga.   Document Released: 04/18/2005 Document Revised: 11/23/2014 Elsevier Interactive Patient Education [employer]

## 2015-09-03 NOTE — ED Notes (Signed)
Pt c/o dry cough onset Thursday with fever and pain in throat last night. Pt has tried thera flu and aleve without relief.

## 2015-09-24 ENCOUNTER — Encounter (HOSPITAL_COMMUNITY): Payer: Self-pay

## 2015-09-24 ENCOUNTER — Emergency Department (HOSPITAL_COMMUNITY)
Admission: EM | Admit: 2015-09-24 | Discharge: 2015-09-24 | Disposition: A | Discharge status: Home / Self Care | Payer: Self-pay | Attending: Emergency Medicine | Admitting: Emergency Medicine

## 2015-09-24 DIAGNOSIS — Z7952 Long term (current) use of systemic steroids: Secondary | ICD-10-CM | POA: Insufficient documentation

## 2015-09-24 DIAGNOSIS — Z79899 Other long term (current) drug therapy: Secondary | ICD-10-CM | POA: Insufficient documentation

## 2015-09-24 DIAGNOSIS — Z792 Long term (current) use of antibiotics: Secondary | ICD-10-CM | POA: Insufficient documentation

## 2015-09-24 DIAGNOSIS — J069 Acute upper respiratory infection, unspecified: Secondary | ICD-10-CM | POA: Insufficient documentation

## 2015-09-24 MED ORDER — DEXAMETHASONE SODIUM PHOSPHATE 10 MG/ML IJ SOLN
10.0000 mg | Freq: Once | INTRAMUSCULAR | Status: AC
  Administered 2015-09-24: 10 mg via INTRAMUSCULAR
  Filled 2015-09-24: qty 1

## 2015-09-24 MED ORDER — BENZONATATE 100 MG PO CAPS: 100.0000 mg | ORAL_CAPSULE | Freq: Three times a day (TID) | ORAL | Status: DC

## 2015-09-24 NOTE — ED Provider Notes (Signed)
CSN: [id number]     Arrival date & time 09/24/15  0011 History   First MD Initiated Contact with Patient 09/24/15 0041     No chief complaint on file.    (Consider location/radiation/quality/duration/timing/severity/associated sxs/prior Treatment) The history is provided by the patient and medical records. No language interpreter was used.     Sandra Gardner is a 60 y.o. female  with who presents to the Emergency Department complaining of worsening, constant sore throat for the last 2 weeks. She was seen on February 13 for cough and congestion symptoms. Given Tessalon for cough which she states provided relief. Patient states cough congestion symptoms have improved however sore throat has not. Denies fever, myalgias.  Past Medical History  Diagnosis Date  . Cystocele   . Urinary incontinence   . Vaginal atrophy    Past Surgical History  Procedure Laterality Date  . Cesarean section    . Cholecystectomy    . Endometrial ablation  2012  . Tubal ligation      BTL-  [id number]   No family history on file. Social History  Substance Use Topics  . Smoking status: Never Smoker   . Smokeless tobacco: Never Used  . Alcohol Use: No   OB History    Gravida Para Term Preterm AB TAB SAB Ectopic Multiple Living   Review of Systems  Constitutional: Negative for fever and chills.  HENT: Positive for sore throat.   Respiratory: Negative for shortness of breath.       Allergies  Review of patient's allergies indicates no known allergies.  Home Medications   Prior to Admission medications   Medication Sig Start Date End Date Taking? Authorizing Provider  benzonatate (TESSALON) 100 MG capsule Take 1 capsule (100 mg total) by mouth every 8 (eight) hours. 09/24/15   Chase Picket Ward, PA-C  HYDROcodone-acetaminophen (HYCET) 7.5-325 mg/15 ml solution Take 10 mLs by mouth 4 (four) times daily as needed for moderate pain. 11/28/13   Fayrene Helper, PA-C  hydrocortisone cream 1 %  Apply to affected area 2 times daily 02/13/14   Tatyana Kirichenko, PA-C  hydrOXYzine (ATARAX/VISTARIL) 25 MG tablet Take 1 tablet (25 mg total) by mouth every 6 (six) hours. 02/13/14   Tatyana Kirichenko, PA-C  levofloxacin (LEVAQUIN) 500 MG tablet Take 1 tablet (500 mg total) by mouth daily. 11/28/13   Fayrene Helper, PA-C  naproxen sodium (ANAPROX) 220 MG tablet Take 220 mg by mouth as needed (pain).    Historical Provider, MD  permethrin (ELIMITE) 5 % cream Apply to entire body other than face - let sit for 14 hours then wash off, may repeat in 1 week if still having symptoms 02/01/14   Roxy Horseman, PA-C  VITAMIN E PO Take 1 tablet by mouth daily.    Historical Provider, MD   BP 123/57 mmHg  Pulse 63  Temp(Src) 98.3 F (36.8 C) (Oral)  Resp 16  SpO2 100% Physical Exam  Constitutional: She is oriented to person, place, and time. She appears well-developed and well-nourished.  Alert and in no acute distress  HENT:  Head: Normocephalic and atraumatic.  Oropharynx with erythema and tonsillar hypertrophy. No tonsillar exudates.  Cardiovascular: Normal rate, regular rhythm, normal heart sounds and intact distal pulses.  Exam reveals no gallop and no friction rub.   No murmur heard. Pulmonary/Chest: Effort normal and breath sounds normal. No respiratory distress. She has no wheezes. She has no  rales. She exhibits no tenderness.  Abdominal: Soft. Bowel sounds are normal. She exhibits no distension. There is no tenderness.  Musculoskeletal: She exhibits no edema.  Neurological: She is alert and oriented to person, place, and time.  Skin: Skin is warm and dry. No rash noted.  Nursing note and vitals reviewed.   ED Course  Procedures (including critical care time) Labs Review Labs Reviewed - No data to display  Imaging Review No results found. I have personally reviewed and evaluated these images and lab results as part of my medical decision-making.   EKG Interpretation None      MDM    Final diagnoses:  URI (upper respiratory infection)   Halina AndreasMaria G Amador presents with a sore throat and cough. On exam, oropharynx with erythema and tonsillar hypertrophy but no exudates. 0 score on Centor criteria, therefore a strep testing and treatment is warranted. Decadron shot given in ED. Tessalon given for cough. Symptomatic care instructions were discussed. Return precautions given. Follow-up with PCP strongly encouraged. All questions answered.  Filed Vitals:   09/24/15 0040 09/24/15 0137  BP: 144/77 123/57  Pulse: 102 63  Temp: 98.2 F (36.8 C) 98.3 F (36.8 C)  Resp: 16 [address]16    Jaime Pilcher Ward, PA-C 09/24/15 0159  Arby BarretteMarcy Pfeiffer, MD 10/02/15 [phone]

## 2015-09-24 NOTE — ED Notes (Signed)
Pt A&OX4 ambulatory at d/c with steady gait, NAD 

## 2015-09-24 NOTE — ED Notes (Signed)
Pt here for sore throat and painful swallowing for 2 weeks.

## 2015-09-24 NOTE — Discharge Instructions (Signed)
1. Medications: mucinex as needed for nasal congestion, tessalon as needed for cough, continue usual home medications 2. Treatment: rest, drink plenty of fluids, take tylenol or ibuprofen for pain  3. Follow Up: Please follow up with your primary doctor in 3 days for discussion of your diagnoses and further evaluation after today's visit; Return to the ER for high fevers, difficulty breathing or other concerning symptoms

## 2016-06-01 ENCOUNTER — Other Ambulatory Visit (HOSPITAL_COMMUNITY): Payer: Self-pay | Admitting: Obstetrics & Gynecology

## 2016-06-11 ENCOUNTER — Other Ambulatory Visit (HOSPITAL_COMMUNITY): Payer: Self-pay | Admitting: *Deleted

## 2016-06-11 DIAGNOSIS — N644 Mastodynia: Secondary | ICD-10-CM

## 2016-07-12 ENCOUNTER — Encounter (HOSPITAL_COMMUNITY): Payer: Self-pay

## 2016-07-12 ENCOUNTER — Ambulatory Visit
Admission: RE | Admit: 2016-07-12 | Discharge: 2016-07-12 | Disposition: A | Discharge status: Home / Self Care | Payer: No Typology Code available for payment source | Source: Ambulatory Visit | Attending: Obstetrics and Gynecology | Admitting: Obstetrics and Gynecology

## 2016-07-12 ENCOUNTER — Ambulatory Visit (HOSPITAL_COMMUNITY)
Admission: RE | Admit: 2016-07-12 | Discharge: 2016-07-12 | Disposition: A | Discharge status: Home / Self Care | Payer: Self-pay | Source: Ambulatory Visit | Attending: Obstetrics and Gynecology | Admitting: Obstetrics and Gynecology

## 2016-07-12 ENCOUNTER — Other Ambulatory Visit: Payer: Self-pay | Admitting: Obstetrics and Gynecology

## 2016-07-12 VITALS — BP 132/88 | Temp 98.1°F | Ht <= 58 in | Wt 128.6 lb

## 2016-07-12 DIAGNOSIS — N644 Mastodynia: Secondary | ICD-10-CM

## 2016-07-12 DIAGNOSIS — Z1239 Encounter for other screening for malignant neoplasm of breast: Secondary | ICD-10-CM

## 2016-07-12 DIAGNOSIS — Z1231 Encounter for screening mammogram for malignant neoplasm of breast: Secondary | ICD-10-CM

## 2016-07-12 NOTE — Patient Instructions (Signed)
Explained breast self awareness to Sandra Gardner. Patient did not need a Pap smear today due to last Pap smear was in September 2017 per patient. Let her know [facility] will cover Pap smears every 3 years unless has a history of abnormal Pap smears. [facility]. Appointment scheduled for Thursday, July 12, 2016 at 1205. Let patient know the Breast Center will follow up with her within the next couple weeks with results of mammogram by letter or phone. Sandra Gardner verbalized understanding.  Atticus Wedin, Kathaleen Maserhristine Poll, RN 12:55 PM

## 2016-07-12 NOTE — Progress Notes (Signed)
No complaints today.   Pap Smear: Pap smear not completed today. [place] patient. Per patient has no history of an abnormal Pap smear. Last Pap smear result is not in EPIC. Previous Pap smear 09/07/2008 is in EPIC.  Physical exam: Breasts Breasts symmetrical. No skin abnormalities bilateral breasts. No nipple retraction bilateral breasts. No nipple discharge bilateral breasts. No lymphadenopathy. No lumps palpated bilateral breasts. No complaints of pain or tenderness on exam. [facility] mamm[facility]. Appointment scheduled for Thursday, July 12, 2016 at 1205.        Pelvic/Bimanual No Pap smear completed today since last Pap smear was in September 2017 per patient. Pap smear not indicated per BCCCP guidelines.   Smoking History: Patient has never smoked.  Patient Navigation: Patient education provided. [facility]. Spanish interpreter provided.  Colorectal Cancer Screening: Per patient has never had a colonoscopy completed. No complaints today.  Used Spanish interpreter [employer] [place] [facility].

## 2016-07-19 ENCOUNTER — Other Ambulatory Visit: Payer: Self-pay | Admitting: Obstetrics and Gynecology

## 2016-07-19 DIAGNOSIS — R928 Other abnormal and inconclusive findings on diagnostic imaging of breast: Secondary | ICD-10-CM

## 2016-07-25 ENCOUNTER — Encounter (HOSPITAL_COMMUNITY): Payer: Self-pay | Admitting: *Deleted

## 2016-07-30 ENCOUNTER — Ambulatory Visit
Admission: RE | Admit: 2016-07-30 | Discharge: 2016-07-30 | Disposition: A | Discharge status: Home / Self Care | Payer: No Typology Code available for payment source | Source: Ambulatory Visit | Attending: Obstetrics and Gynecology | Admitting: Obstetrics and Gynecology

## 2016-07-30 DIAGNOSIS — R928 Other abnormal and inconclusive findings on diagnostic imaging of breast: Secondary | ICD-10-CM

## 2016-08-06 ENCOUNTER — Ambulatory Visit: Payer: No Typology Code available for payment source | Admitting: Family Medicine

## 2016-08-15 ENCOUNTER — Ambulatory Visit: Payer: No Typology Code available for payment source | Admitting: Family Medicine

## 2016-08-15 ENCOUNTER — Ambulatory Visit: Payer: Self-pay | Attending: Family Medicine

## 2016-08-17 ENCOUNTER — Encounter: Payer: Self-pay | Admitting: Family Medicine

## 2016-08-17 ENCOUNTER — Ambulatory Visit: Payer: Self-pay | Attending: Family Medicine | Admitting: Family Medicine

## 2016-08-17 VITALS — BP 152/84 | HR 67 | Temp 98.6°F | Resp 18 | Ht <= 58 in | Wt 132.0 lb

## 2016-08-17 DIAGNOSIS — Z79899 Other long term (current) drug therapy: Secondary | ICD-10-CM | POA: Insufficient documentation

## 2016-08-17 DIAGNOSIS — R221 Localized swelling, mass and lump, neck: Secondary | ICD-10-CM | POA: Insufficient documentation

## 2016-08-17 DIAGNOSIS — Z0000 Encounter for general adult medical examination without abnormal findings: Secondary | ICD-10-CM

## 2016-08-17 DIAGNOSIS — Z800 Family history of malignant neoplasm of digestive organs: Secondary | ICD-10-CM | POA: Insufficient documentation

## 2016-08-17 DIAGNOSIS — Z7689 Persons encountering health services in other specified circumstances: Secondary | ICD-10-CM | POA: Insufficient documentation

## 2016-08-17 LAB — CBC WITH DIFFERENTIAL/PLATELET
BASOS PCT: 0 %
Basophils Absolute: 0 cells/uL (ref 0–200)
EOS ABS: 540 {cells}/uL — AB (ref 15–500)
Eosinophils Relative: 5 %
HCT: 44.1 % (ref 35.0–45.0)
Hemoglobin: 14.8 g/dL (ref 11.7–15.5)
LYMPHS PCT: 40 %
Lymphs Abs: 4320 cells/uL — ABNORMAL HIGH (ref 850–3900)
MCH: 29.7 pg (ref 27.0–33.0)
MCHC: 33.6 g/dL (ref 32.0–36.0)
MCV: 88.4 fL (ref 80.0–100.0)
MONOS PCT: 6 %
MPV: 10.5 fL (ref 7.5–12.5)
Monocytes Absolute: 648 cells/uL (ref 200–950)
Neutro Abs: 5292 cells/uL (ref 1500–7800)
Neutrophils Relative %: 49 %
PLATELETS: 277 10*3/uL (ref 140–400)
RBC: 4.99 MIL/uL (ref 3.80–5.10)
RDW: 14.2 % (ref 11.0–15.0)
WBC: 10.8 10*3/uL (ref 3.8–10.8)

## 2016-08-17 NOTE — Patient Instructions (Signed)
Tomografía computarizada  (CT Scan)  Una tomografía computarizada es un tipo de radiografía que toma una serie de imágenes de ciertas partes del cuerpo. Es una forma indolora con la que el médico puede ver el interior del cuerpo. El escáner para la tomografía computarizada es una gran máquina que posee una apertura. El cuerpo pasa a través de la apertura para que puedan obtener las imágenes.  ANTES DEL PROCEDIMIENTO  · El día anterior al procedimiento, no ingiera bebidas con cafeína. Estas bebidas incluyen las bebidas energizantes, el té, los refrescos, el café y el chocolate caliente.  · El día del estudio:  ? No coma durante, al menos, 4 horas antes del estudio o según le indique su médico.  ? No beba nada excepto agua durante, al menos, 4 horas antes del estudio o según le indique su médico.  ? Deje sus alhajas en su casa. Deberá quitarse toda, o casi toda, la ropa. El médico le entregará una bata.    PROCEDIMIENTO  · Deberá recostarse sobre una camilla con los brazos por encima de la cabeza.  · Es posible que le inserten una vía intravenosa en el brazo. De ser así, transferirán líquidos a través de la vía. Esto hará que sienta calor, o gusto metálico en la boca.  · La camilla sobre la cual estará recostado se desplazará dentro de un equipo de gran tamaño.  · Podrá ver, escuchar y hablar con la persona que maneja el equipo mientras esté dentro de este. Siga las indicaciones de esa persona.  · El equipo de tomografía computarizada se moverá alrededor suyo para tomar las imágenes. No se mueva.  · Cuando la máquina termine de tomar las imágenes, se apagará.  · La camilla se desplazará fuera de este.  · Si le colocaron una vía intravenosa, se la retirarán.    DESPUÉS DEL PROCEDIMIENTO  Consulte a su médico cuándo llamar o retirar los resultados.  Esta información no tiene como fin reemplazar el consejo del médico. Asegúrese de hacerle al médico cualquier pregunta que tenga.   Document Released: 06/28/2011 Document Revised: 07/14/2013 Document Reviewed: 03/25/2013  Elsevier Interactive Patient Education © 2017 Elsevier Inc.

## 2016-08-17 NOTE — Progress Notes (Signed)
Patient is here for established care  Patient is here for refer to the colonoscopy  Patient has eaten today & has not taking any meds today  Patient declined the flu shot today

## 2016-08-17 NOTE — Progress Notes (Signed)
Subjective:  Patient ID: Sandra Gardner, female    DOB: 1956/07/04  Age: 61 y.o. MRN: [id number]  CC: Establish Care   HPI Sandra Gardner presents for referral for colonoscopy. She denies any hematochezia or pencil thin stools. She denies any consitutional symptoms,  poor appetite, difficulty swallowing. Reports paternal uncle has a history of colorectal cancer. She c/o right sided neck lump. Denies any fevers, cough, hemoptysis, or consitutional symptoms.       Outpatient Medications Prior to Visit  Medication Sig Dispense Refill  . benzonatate (TESSALON) 100 MG capsule Take 1 capsule (100 mg total) by mouth every 8 (eight) hours. (Patient not taking: Reported on 07/12/2016) 21 capsule 0  . HYDROcodone-acetaminophen (HYCET) 7.5-325 mg/15 ml solution Take 10 mLs by mouth 4 (four) times daily as needed for moderate pain. (Patient not taking: Reported on 07/12/2016) 120 mL 0  . hydrocortisone cream 1 % Apply to affected area 2 times daily (Patient not taking: Reported on 07/12/2016) 15 g 0  . hydrOXYzine (ATARAX/VISTARIL) 25 MG tablet Take 1 tablet (25 mg total) by mouth every 6 (six) hours. (Patient not taking: Reported on 07/12/2016) 30 tablet 0  . levofloxacin (LEVAQUIN) 500 MG tablet Take 1 tablet (500 mg total) by mouth daily. (Patient not taking: Reported on 07/12/2016) 5 tablet 0  . naproxen sodium (ANAPROX) 220 MG tablet Take 220 mg by mouth as needed (pain).    . permethrin (ELIMITE) 5 % cream Apply to entire body other than face - let sit for 14 hours then wash off, may repeat in 1 week if still having symptoms (Patient not taking: Reported on 07/12/2016) 60 g 1  . VITAMIN E PO Take 1 tablet by mouth daily.     No facility-administered medications prior to visit.     ROS Review of Systems  Constitutional: Negative.   Respiratory: Negative.   Cardiovascular: Negative.   Gastrointestinal: Negative.   Skin:       Right sided neck lump.   Objective:  BP (!) 152/84 (BP  Location: Left Arm, Patient Position: Sitting, Cuff Size: Normal)   Pulse 67   Temp 98.6 F (37 C) (Oral)   Resp 18   Ht 4\' 9"  (1.448 m)   Wt 132 lb (59.9 kg)   SpO2 97%   BMI 28.56 kg/m   BP/Weight 08/17/2016 07/12/2016 09/24/2015  Systolic BP 152 132 123  Diastolic BP 84 88 57  Wt. (Lbs) 132 128.6 -  BMI 28.56 27.83 -   Physical Exam  HENT:  Right Ear: External ear normal.  Left Ear: External ear normal.  Nose: Nose normal.  Mouth/Throat: Oropharynx is clear and moist.  Neck: Full passive range of motion without pain. Neck supple. No thyroid mass and no thyromegaly present.  Cardiovascular: Normal rate, regular rhythm, normal heart sounds and intact distal pulses.   Pulmonary/Chest: Effort normal and breath sounds normal.  Abdominal: Soft. Bowel sounds are normal.  Lymphadenopathy:    She has no cervical adenopathy (Lump located right lateral neck. Lump is soft, not fixed, and non-tender ).  Skin: Skin is warm and dry.   Assessment & Plan:   Problem List Items Addressed This Visit    None    Visit Diagnoses    Lump on neck    -  Primary   -Pt.denies any constitutional symptoms or hemoptysis, and no lymphadenopathy      Present. Suspect lump may be a benign finding. Lump is very concerning to patient.  Relevant Orders   CT SOFT TISSUE NECK W CONTRAST   Basic Metabolic Panel   CBC with Differential   C-reactive protein   Sedimentation Rate   Healthcare maintenance       Relevant Orders   Ambulatory referral to Gastroenterology       Follow-up: Return for As needed.Lizbeth Bark FNP

## 2016-08-18 LAB — BASIC METABOLIC PANEL
BUN: 13 mg/dL (ref 7–25)
CO2: 26 mmol/L (ref 20–31)
Calcium: 9.8 mg/dL (ref 8.6–10.4)
Chloride: 102 mmol/L (ref 98–110)
Creat: 0.62 mg/dL (ref 0.50–1.05)
Glucose, Bld: 120 mg/dL — ABNORMAL HIGH (ref 65–99)
POTASSIUM: 3.6 mmol/L (ref 3.5–5.3)
Sodium: 139 mmol/L (ref 135–146)

## 2016-08-18 LAB — SEDIMENTATION RATE: SED RATE: 4 mm/h (ref 0–30)

## 2016-08-20 ENCOUNTER — Telehealth: Payer: Self-pay

## 2016-08-20 LAB — C-REACTIVE PROTEIN: CRP: 3.4 mg/L (ref <8.0)

## 2016-08-20 NOTE — Telephone Encounter (Signed)
CMA call to inform  CT neck appt at [facility] on 08/23/16 @ 4:00 pm  Patient was aware of her appt and also was aware no eating only liquid prior 4 hrs to her appt

## 2016-08-23 ENCOUNTER — Ambulatory Visit (HOSPITAL_COMMUNITY)
Admission: RE | Admit: 2016-08-23 | Discharge: 2016-08-23 | Disposition: A | Discharge status: Home / Self Care | Payer: Self-pay | Source: Ambulatory Visit | Attending: Family Medicine | Admitting: Family Medicine

## 2016-08-23 DIAGNOSIS — R221 Localized swelling, mass and lump, neck: Secondary | ICD-10-CM | POA: Insufficient documentation

## 2016-08-23 MED ORDER — IOPAMIDOL (ISOVUE-300) INJECTION 61%
INTRAVENOUS | Status: DC
Start: 2016-08-23 — End: 2016-08-24
  Filled 2016-08-23: qty 75

## 2016-08-30 ENCOUNTER — Telehealth: Payer: Self-pay

## 2016-08-30 NOTE — Telephone Encounter (Signed)
Pt left Vm that she is ready to schedule colonoscopy.  

## 2016-08-31 ENCOUNTER — Other Ambulatory Visit: Payer: Self-pay | Admitting: Family Medicine

## 2016-08-31 ENCOUNTER — Telehealth: Payer: Self-pay

## 2016-08-31 DIAGNOSIS — M2420 Disorder of ligament, unspecified site: Secondary | ICD-10-CM

## 2016-08-31 DIAGNOSIS — E041 Nontoxic single thyroid nodule: Secondary | ICD-10-CM

## 2016-08-31 NOTE — Telephone Encounter (Signed)
CMA Call to go over lab results   Patient Verify DOB  Patient was aware and understood

## 2016-08-31 NOTE — Telephone Encounter (Signed)
CMA call to go over CT scan results   Patient did not answer CMA left a VMM stating the reason of the call and to call us back

## 2016-08-31 NOTE — Telephone Encounter (Signed)
-----   Message from Lizbeth BarkMandesia R Hairston, FNP sent at 08/31/2016  6:16 AM EST ----- -CT showed thyroid nodule. Recommend scheduling appointment with lab today or next week to have blood work to evaluate thyroid function drawn. CT also showed hardening of neck ligament that can cause throat pain, difficulty swallowing, foreign body sensation, or facial pain. You will be referred to a ENT specialist.  -Labs were normal. -Labs that check for inflammation were normal.

## 2016-08-31 NOTE — Telephone Encounter (Signed)
Patient returned nurse's call. Please follow up. Pt gave the verbal ok to leave a detailed message if she doesn't pick up.   Thank you.

## 2016-09-06 ENCOUNTER — Encounter: Payer: Self-pay | Admitting: Family Medicine

## 2016-09-06 ENCOUNTER — Ambulatory Visit: Payer: Self-pay | Attending: Family Medicine

## 2016-09-06 DIAGNOSIS — E041 Nontoxic single thyroid nodule: Secondary | ICD-10-CM | POA: Insufficient documentation

## 2016-09-07 LAB — THYROID PANEL WITH TSH
Free Thyroxine Index: 2.1 (ref 1.4–3.8)
T3 Uptake: 28 % (ref 22–35)
T4 TOTAL: 7.6 ug/dL (ref 4.5–12.0)
TSH: 1.94 mIU/L

## 2016-09-10 ENCOUNTER — Telehealth: Payer: Self-pay

## 2016-09-10 NOTE — Telephone Encounter (Signed)
[phone] PLEASE CALL PT TO SCHEDULE A TCS

## 2016-09-12 ENCOUNTER — Telehealth: Payer: Self-pay

## 2016-09-12 NOTE — Telephone Encounter (Signed)
-----   Message from Lizbeth BarkMandesia R Hairston, [place] sent at 09/11/2016  7:45 PM EST ----- -Thyroid function is normal. You do not have hyper or hypothyroidism.

## 2016-09-12 NOTE — Telephone Encounter (Signed)
CMA call to go over her thyroid function results  Patient Verify DOB  Patient was aware and understood

## 2016-09-13 ENCOUNTER — Telehealth: Payer: Self-pay

## 2016-09-13 NOTE — Telephone Encounter (Signed)
Triaged today.  

## 2016-09-24 ENCOUNTER — Ambulatory Visit (INDEPENDENT_AMBULATORY_CARE_PROVIDER_SITE_OTHER): Payer: Self-pay | Admitting: Otolaryngology

## 2016-09-24 DIAGNOSIS — R070 Pain in throat: Secondary | ICD-10-CM

## 2016-09-24 NOTE — Telephone Encounter (Signed)
LMOM to call back

## 2016-09-24 NOTE — Telephone Encounter (Signed)
See separate triage.  

## 2016-09-24 NOTE — Telephone Encounter (Signed)
Gastroenterology Pre-Procedure Review  Request Date: 09/13/2016   Requesting Physician: Lizbeth BarkMandesia R Hairston, FNP  PATIENT REVIEW QUESTIONS: The patient responded to the following health history questions as indicated:    1. Diabetes Melitis: no 2. Joint replacements in the past 12 months: no 3. Major health problems in the past 3 months: no 4. Has an artificial valve or MVP: no 5. Has a defibrillator: no 6. Has been advised in past to take antibiotics in advance of a procedure like teeth cleaning: no 7. Family history of colon cancer: no  8. Alcohol Use: no 9. History of sleep apnea: no  10. History of coronary artery or other vascular stents placed within the last 12 months: no    MEDICATIONS & ALLERGIES:    Patient reports the following regarding taking any blood thinners:   Plavix? no Aspirin? no Coumadin? no Brilinta? no Xarelto? no Eliquis? no Pradaxa? no Savaysa? no Effient? no  Patient confirms/reports the following medications:  Current Outpatient Prescriptions  Medication Sig Dispense Refill  . hydrocortisone cream 1 % Apply to affected area 2 times daily 15 g 0  . naproxen sodium (ANAPROX) 220 MG tablet Take 220 mg by mouth as needed (pain).    Marland Kitchen. VITAMIN E PO Take 1 tablet by mouth daily.    . benzonatate (TESSALON) 100 MG capsule Take 1 capsule (100 mg total) by mouth every 8 (eight) hours. (Patient not taking: Reported on 07/12/2016) 21 capsule 0  . HYDROcodone-acetaminophen (HYCET) 7.5-325 mg/15 ml solution Take 10 mLs by mouth 4 (four) times daily as needed for moderate pain. (Patient not taking: Reported on 07/12/2016) 120 mL 0  . hydrOXYzine (ATARAX/VISTARIL) 25 MG tablet Take 1 tablet (25 mg total) by mouth every 6 (six) hours. (Patient not taking: Reported on 07/12/2016) 30 tablet 0  . levofloxacin (LEVAQUIN) 500 MG tablet Take 1 tablet (500 mg total) by mouth daily. (Patient not taking: Reported on 07/12/2016) 5 tablet 0  . permethrin (ELIMITE) 5 % cream  Apply to entire body other than face - let sit for 14 hours then wash off, may repeat in 1 week if still having symptoms (Patient not taking: Reported on 07/12/2016) 60 g 1   No current facility-administered medications for this visit.     Patient confirms/reports the following allergies:  No Known Allergies  No orders of the defined types were placed in this encounter.   AUTHORIZATION INFORMATION Primary Insurance:  ID #:   Group #:  Pre-Cert / Auth required:  Pre-Cert / Auth #:   Secondary Insurance:  ID #:  Group #:  Pre-Cert / Auth required:  Pre-Cert / Auth #:   SCHEDULE INFORMATION: Procedure has been scheduled as follows:  Date:                      Time:   Location:   This Gastroenterology Pre-Precedure Review Form is being routed to the following provider(s): Jonette EvaSandi Fields, MD

## 2016-09-24 NOTE — Telephone Encounter (Signed)
Trilyte SPLIT DOSING, CLEAR LIQUIDS WITH BREAKFAST.

## 2016-09-24 NOTE — Telephone Encounter (Signed)
To [facility] to mail instructions.  

## 2016-09-25 ENCOUNTER — Other Ambulatory Visit: Payer: Self-pay

## 2016-09-25 DIAGNOSIS — Z1211 Encounter for screening for malignant neoplasm of colon: Secondary | ICD-10-CM

## 2016-09-25 MED ORDER — PEG 3350-KCL-NA BICARB-NACL 420 G PO SOLR: 4000.0000 mL | ORAL | 0 refills | Status: DC

## 2016-09-25 NOTE — Telephone Encounter (Signed)
Pt would like have her TCS done on 10/18/16 @ 12:30 pm.

## 2016-10-16 ENCOUNTER — Other Ambulatory Visit: Payer: Self-pay | Admitting: Family Medicine

## 2016-10-16 ENCOUNTER — Telehealth: Payer: Self-pay

## 2016-10-16 NOTE — Telephone Encounter (Signed)
I did not prescribe this pre-procedural medication for this patient. Patient will need to follow up with her GI doctor, Dr.Fields. Will route to [place] since she is following up with calls this am.

## 2016-10-16 NOTE — Telephone Encounter (Signed)
Notified patient that she must call gastroendo

## 2016-10-16 NOTE — Telephone Encounter (Signed)
Notified patient the she must call her gastroenterology office to have medications filled for upcoming procedure.

## 2016-10-16 NOTE — Telephone Encounter (Signed)
Pt calling to inform Dr that she was unable to pick up script for meds needed for colonoscopy and that when she went to get them, the pharmacy told her that the provider needed to resend the script. Pt has procedure scheduled for this Thursday. [facility] [address] Please f/u with pt.

## 2016-10-17 ENCOUNTER — Other Ambulatory Visit: Payer: Self-pay

## 2016-10-17 MED ORDER — PEG 3350-KCL-NA BICARB-NACL 420 G PO SOLR: 4000.0000 mL | ORAL | 0 refills | Status: DC

## 2016-10-18 ENCOUNTER — Encounter (HOSPITAL_COMMUNITY): Payer: Self-pay | Admitting: *Deleted

## 2016-10-18 ENCOUNTER — Ambulatory Visit (HOSPITAL_COMMUNITY)
Admission: RE | Admit: 2016-10-18 | Discharge: 2016-10-18 | Disposition: A | Discharge status: Home / Self Care | Payer: Self-pay | Source: Ambulatory Visit | Attending: Gastroenterology | Admitting: Gastroenterology

## 2016-10-18 ENCOUNTER — Encounter (HOSPITAL_COMMUNITY)
Admission: RE | Disposition: A | Discharge status: Home / Self Care | Payer: Self-pay | Source: Ambulatory Visit | Attending: Gastroenterology

## 2016-10-18 DIAGNOSIS — Z1211 Encounter for screening for malignant neoplasm of colon: Secondary | ICD-10-CM

## 2016-10-18 DIAGNOSIS — Z1212 Encounter for screening for malignant neoplasm of rectum: Secondary | ICD-10-CM

## 2016-10-18 DIAGNOSIS — K648 Other hemorrhoids: Secondary | ICD-10-CM | POA: Insufficient documentation

## 2016-10-18 DIAGNOSIS — Z79899 Other long term (current) drug therapy: Secondary | ICD-10-CM | POA: Insufficient documentation

## 2016-10-18 DIAGNOSIS — Q438 Other specified congenital malformations of intestine: Secondary | ICD-10-CM | POA: Insufficient documentation

## 2016-10-18 HISTORY — PX: COLONOSCOPY: SHX5424

## 2016-10-18 SURGERY — COLONOSCOPY
Anesthesia: Moderate Sedation

## 2016-10-18 MED ORDER — MIDAZOLAM HCL 5 MG/5ML IJ SOLN
INTRAMUSCULAR | Status: AC
  Filled 2016-10-18: qty 10

## 2016-10-18 MED ORDER — MEPERIDINE HCL 100 MG/ML IJ SOLN
INTRAMUSCULAR | Status: AC
  Filled 2016-10-18: qty 2

## 2016-10-18 MED ORDER — SODIUM CHLORIDE 0.9 % IV SOLN
INTRAVENOUS | Status: DC
  Administered 2016-10-18: 1000 mL via INTRAVENOUS

## 2016-10-18 MED ORDER — MIDAZOLAM HCL 5 MG/5ML IJ SOLN
INTRAMUSCULAR | Status: DC | PRN
  Administered 2016-10-18 (×2): 2 mg via INTRAVENOUS
  Administered 2016-10-18: 1 mg via INTRAVENOUS

## 2016-10-18 MED ORDER — ONDANSETRON HCL 4 MG/2ML IJ SOLN
INTRAMUSCULAR | Status: AC
  Filled 2016-10-18: qty 2

## 2016-10-18 MED ORDER — MEPERIDINE HCL 100 MG/ML IJ SOLN
INTRAMUSCULAR | Status: DC | PRN
  Administered 2016-10-18 (×3): 25 mg via INTRAVENOUS

## 2016-10-18 NOTE — Progress Notes (Addendum)
Interpreter services used pre procedure by Dr. Darrick PennaFields in Endo 2 for explanations and questions.    Interpreter No. [id number]

## 2016-10-18 NOTE — H&P (Addendum)
  Primary Care Physician:  Arrie SenateMandesia Hairston, FNP Primary Gastroenterologist:  Dr. Darrick PennaFields  Pre-Procedure History & Physical: HPI:  Sandra Gardner is a 61 y.o. female here for COLON CANCER SCREENING.  Past Medical History:  Diagnosis Date  . Cystocele   . Urinary incontinence   . Vaginal atrophy     Past Surgical History:  Procedure Laterality Date  . CESAREAN SECTION     3 previous  . CHOLECYSTECTOMY    . ENDOMETRIAL ABLATION  2012  . TUBAL LIGATION     BTL-  07-6107-  02-1984    Prior to Admission medications   Medication Sig Start Date End Date Taking? Authorizing Provider  naproxen sodium (ANAPROX) 220 MG tablet Take 220 mg by mouth as needed (pain).   Yes Historical Provider, MD  polyethylene glycol-electrolytes (TRILYTE) 420 g solution Take 4,000 mLs by mouth as directed. 10/17/16  Yes West BaliSandi L Kelso Bibby, MD  vitamin E 400 UNIT capsule Take 400 Units by mouth daily.   Yes Historical Provider, MD    Allergies as of 09/25/2016  . (No Known Allergies)    Family History  Problem Relation Age of Onset  . Diabetes Father   . Hypertension Paternal Grandmother   . Thyroid disease Sister   UNCLE: COLON CANCER  Social History   Social History  . Marital status: Married    Spouse name: N/A  . Number of children: N/A  . Years of education: N/A   Occupational History  . Not on file.   Social History Main Topics  . Smoking status: Never Smoker  . Smokeless tobacco: Never Used  . Alcohol use No  . Drug use: No  . Sexual activity: Yes    Birth control/ protection: Post-menopausal, Surgical   Other Topics Concern  . Not on file   Social History Narrative  . No narrative on file    Review of Systems: See HPI, otherwise negative ROS  Physical Exam: BP (!) 170/83   Pulse 73   Temp 98.7 F (37.1 C) (Oral)   Resp 17   Ht 4\' 9"  (1.448 m)   Wt 129 lb (58.5 kg)   SpO2 99%   BMI 27.92 kg/m  General:   Alert,  pleasant and cooperative in NAD Head:  Normocephalic and  atraumatic. Neck:  Supple; Lungs:  Clear throughout to auscultation.    Heart:  Regular rate and rhythm. Abdomen:  Soft, nontender and nondistended. Normal bowel sounds, without guarding, and without rebound.   Neurologic:  Alert and  oriented x4;  grossly normal neurologically.  Impression/Plan:     SCREENING  Plan:  1. TCS TODAY. DISCUSSED PROCEDURE, BENEFITS, & RISKS: < 1% chance of medication reaction, bleeding, perforation, or rupture of spleen/liver via interpreter [phone].

## 2016-10-18 NOTE — OR Nursing (Signed)
Interpretor Domingo Cockingduardo [phone] was used to complete pre-op.

## 2016-10-18 NOTE — Discharge Instructions (Signed)
TIENNES HEMORRHOIDES. NO TIENES POLIPOS.   Sigue un Dieta con alto contenido de Oberlinfibra. EVITAR EL ARTICULO QUE CAUSA  HINCHAZON Y GAS.  PROXINA Colonoscopa in 10 anos.  Colonoscopa: cuidados posteriores (Colonoscopy, Care After) Estas indicaciones le proporcionan informacin general acerca de cmo deber cuidarse despus del procedimiento. El mdico tambin podr darle instrucciones especficas. Comunquese con el mdico si tiene algn problema o tiene preguntas despus del procedimiento. CUIDADOS EN EL HOGAR  No conduzca durante 24horas.  No firme papeles importantes ni use maquinaria pesada durante 24horas.  Puede ducharse.  Puede retomar las actividades habituales, pero hgalo ms despacio durante las primeras 24horas.  Durante las primeras 24horas, descanse con frecuencia.  Camine o pngase compresas tibias en el vientre (abdomen) si tiene clicos intestinales o gases.  Beba suficiente lquido para mantener el pis (orina) claro o de color amarillo plido.  Retome su dieta normal. No coma comidas pesadas ni fritas.  No tome alcohol durante 24horas, o segn el mdico le indique.  Tome solo los medicamentos segn le haya indicado el mdico. Si se obtuvo una muestra de tejido (biopsia) durante el procedimiento:   No tome aspirina ni anticoagulantes durante 7das, o segn el mdico le indique.  No tome alcohol durante 7das, o segn el mdico le indique.  Consuma alimentos livianos durante las primeras 24horas. SOLICITE AYUDA SI: An hay una pequea cantidad de sangre en la materia fecal (heces) 2 o 3das despus del procedimiento. SOLICITE AYUDA DE INMEDIATO SI:  Hay ms que una pequea cantidad de Kohl'ssangre en la materia fecal.  Observa grumos de tejido (cogulos de Two Riverssangre) en la materia fecal.  Tiene el vientre inflamado (hinchado).  Tiene malestar estomacal (nuseas) o vomita.  Tiene fiebre.  Siente que Chief Technology Officerel dolor en el vientre empeora y no se alivia con  los medicamentos.   Dieta con alto contenido de fibra  (High QUALCOMMFiber Diet)  La fibra se encuentra en frutas, verduras y granos. Una dieta con alto contenido en fibras se favorece con la adicin de ms granos enteros, legumbres, frutas y verduras en su dieta. La cantidad recomendada de fibra para los hombres adultos es de 38 g por da. Para las mujeres adultas es de 25 g por da. Las AMR Corporationmujeres embarazadas y las que amamantan deben consumir 28 gramos de fibra por Futures traderda. Si usted tiene un problema digestivo o intestinal, consulte a su mdico antes de la adicin de alimentos ricos en fibra a su dieta. Coma una variedad de alimentos ricos en fibra en lugar de slo unos pocos.  OBJETIVO  Aumentar la masa fecal.  Tener deposiciones ms regulares para evitar el estreimiento.  Reducir el colesterol.  Para evitar comer en exceso. CUANDO SE UTILIZA ESTA DIETA?  En caso de estreimiento y hemorroides.  En caso de diverticulosis no complicada (enfermedad intestinal) y en el sndrome del colon irritable.  Si necesita ayuda para el control de Chevalpeso.  Si desea mejorar su dieta como medida de proteccin contra la aterosclerosis, la diabetes y Management consultantel cncer. FUENTES DE FIBRA  Panes y cereales integrales.  Frutas, como las Scobeymanzanas, Webbervillenaranjas, pltanos, fresas, Environmental managerciruelas y peras.  Verduras, como guisantes, zanahorias, batatas, remolachas, brcoli, repollo, espinacas y alcauciles.  Legumbres, las arvejas, soja, lentejas.  Almendras. CONTENIDO DE FIBRA DE LOS ALIMENTOS  Almidones y granos / Fibra Diettica (g)  Cheerios, 1 taza / 3 g  Corn Flakes, 1 taza / 0,7 g  Arroz inflado, 1  tazas / 0,3 g  Harina de avena instantnea (cocida),  taza / 2 g  Cereal de trigo escarchado, 1 taza / 5,1 g  Arroz marrn grano largo (cocido), 1 taza / 3,5 g  Arroz blanco grano largo (cocido), 1 taza / 0,6 g  Macarrones enriquecidos (cocidos), 1 taza / 2,5 g Legumbres / Fibra Diettica (g)  Frijoles cocidos (enlatados, crudos o  vegetarianos),  taza / 5,2 g  Frijoles (enlatados),  taza / 6,8 g  Frijoles pintos (cocidos),  taza / 5,5 g Panes y Gaffer / Media planner (g)  Galletas de graham o miel, 2 plazas / 0,7 g  Galletitas saladas, 3 unidades / 0,3 g  Pretzels salados comunes, 10 pedazos / 1,8 g  Pan integral, 1 rebanada / 1,9 g  Pan blanco, 1 rebanada / 0,7 g  Pan con pasas, 1 rebanada / 1,2 g  Bagel 3 oz / 2 g  Tortilla de harina, 1 oz / 0.9 g  Tortilla de maz, 1 pequea / 1,5 g  Pan de amburguesa o hot dog, 1 pequeo / 0,9 g Frutas / Fibra Diettica (g)  Manzana con piel, 1 mediana / 4,4 g  Pur de [employer] Corporation,  taza / 1,5 g  Pltano,  mediano / 1,5 g  Uvas, 10 uvas / 0,4 g  Naranja, 1 pequea / 2,3 g  Pasas, 1,5 oz / 1.6 g  Meln, 1 taza / 1,4 g Vegetales / Fibra Diettica (g)  Judas verdes (en conserva),  taza / 1,3 g  Zanahorias (cocido),  taza / 2,3 g  Broccoli (cocido),  taza / 2,8 g  Guisantes (cocidos),  taza / 4,4 g  Pur de papas,  taza / 1,6 g  Lechuga, 1 taza / 0,5 g  Maz (en lata),  taza / 1,6 g  Tomate,  taza / 1,1 g 1 cup / 3 g.

## 2016-10-18 NOTE — Op Note (Signed)
[facility] Patient Name: Sandra Gardner Procedure Date: 10/18/2016 12:31 PM MRN: [id number] Date of Birth: 07/04/1956 Attending MD: Jonette Eva , MD CSN: [id number] Age: 61 Admit Type: Outpatient Procedure:                Colonoscopy, SCREENING Indications:              Screening for colorectal malignant neoplasm Providers:                Jonette Eva, MD, Edrick Kins, RN, Dayton Scrape RN, RN, Toniann Fail RN, RN Referring MD:             Oren Beckmann Hairston Medicines:                Meperidine 75 mg IV, Midazolam 5 mg IV Complications:            No immediate complications. Estimated Blood Loss:     Estimated blood loss: none. Procedure:                Pre-Anesthesia Assessment:                           - Prior to the procedure, a History and Physical                            was performed, and patient medications and                            allergies were reviewed. The patient's tolerance of                            previous anesthesia was also reviewed. The risks                            and benefits of the procedure and the sedation                            options and risks were discussed with the patient.                            All questions were answered, and informed consent                            was obtained. Prior Anticoagulants: The patient has                            taken naproxen. ASA Grade Assessment: I - A normal,                            healthy patient. After reviewing the risks and                            benefits, the patient was deemed in satisfactory  condition to undergo the procedure. After obtaining                            informed consent, the colonoscope was passed under                            direct vision. Throughout the procedure, the                            patient's blood pressure, pulse, and oxygen                            saturations were  monitored continuously. The                            EC-[id number] ([id number] scope was introduced through                            the anus and advanced to the the cecum, identified                            by appendiceal orifice and ileocecal valve. The                            colonoscopy was technically difficult and complex                            due to restricted mobility of the colon. Successful                            completion of the procedure was aided by COLOWRAP.                            The patient tolerated the procedure fairly well.                            The quality of the bowel preparation was excellent.                            The ileocecal valve, appendiceal orifice, and                            rectum were photographed. Scope In: 1:25:29 PM Scope Out: 1:39:37 PM Scope Withdrawal Time: 0 hours 10 minutes 44 seconds  Total Procedure Duration: 0 hours 14 minutes 8 seconds  Findings:      The recto-sigmoid colon was moderately redundant.      Internal hemorrhoids were found during retroflexion. The hemorrhoids       were small.      The exam was otherwise normal throughout the examined colon. Impression:               - Redundant rectosigmoid colon.                           - Internal  hemorrhoids. Moderate Sedation:      Moderate (conscious) sedation was administered by the endoscopy nurse       and supervised by the endoscopist. The following parameters were       monitored: oxygen saturation, heart rate, blood pressure, and response       to care. Total physician intraservice time was 20 minutes. Recommendation:           - High fiber diet.                           - Continue present medications.                           - Repeat colonoscopy in 10 years for surveillance                            w/ COLOWRAP.                           - Patient has a contact number available for                            emergencies. The signs and symptoms  of potential                            delayed complications were discussed with the                            patient. Return to normal activities tomorrow.                            Written discharge instructions were provided to the                            patient. Procedure Code(s):        --- Professional ---                           [phone], Colonoscopy, flexible; diagnostic, including                            collection of specimen(s) by brushing or washing,                            when performed (separate procedure)                           99152, Moderate sedation services provided by the                            same physician or other qualified health care                            professional performing the diagnostic or  therapeutic service that the sedation supports,                            requiring the presence of an independent trained                            observer to assist in the monitoring of the                            patient's level of consciousness and physiological                            status; initial 15 minutes of intraservice time,                            patient age 1 years or older Diagnosis Code(s):        --- Professional ---                           Z12.11, Encounter for screening for malignant                            neoplasm of colon                           K64.8, Other hemorrhoids                           Q43.8, Other specified congenital malformations of                            intestine CPT copyright 2016 American Medical Association. All rights reserved. The codes documented in this report are preliminary and upon coder review may  be revised to meet current compliance requirements. Jonette Eva, MD Jonette Eva, MD 10/18/2016 1:58:31 PM This report has been signed electronically. Number of Addenda: 0

## 2016-10-22 ENCOUNTER — Encounter (HOSPITAL_COMMUNITY): Payer: Self-pay | Admitting: Gastroenterology

## 2017-03-06 ENCOUNTER — Ambulatory Visit: Payer: Self-pay

## 2017-03-14 ENCOUNTER — Ambulatory Visit: Payer: Self-pay | Attending: Family Medicine

## 2017-05-15 ENCOUNTER — Emergency Department (HOSPITAL_COMMUNITY)
Admission: EM | Admit: 2017-05-15 | Discharge: 2017-05-15 | Disposition: A | Discharge status: Home / Self Care | Payer: Self-pay | Attending: Emergency Medicine | Admitting: Emergency Medicine

## 2017-05-15 ENCOUNTER — Encounter (HOSPITAL_COMMUNITY): Payer: Self-pay | Admitting: Emergency Medicine

## 2017-05-15 DIAGNOSIS — Y92039 Unspecified place in apartment as the place of occurrence of the external cause: Secondary | ICD-10-CM | POA: Insufficient documentation

## 2017-05-15 DIAGNOSIS — W01198A Fall on same level from slipping, tripping and stumbling with subsequent striking against other object, initial encounter: Secondary | ICD-10-CM | POA: Insufficient documentation

## 2017-05-15 DIAGNOSIS — S0083XA Contusion of other part of head, initial encounter: Secondary | ICD-10-CM | POA: Insufficient documentation

## 2017-05-15 DIAGNOSIS — Z79899 Other long term (current) drug therapy: Secondary | ICD-10-CM | POA: Insufficient documentation

## 2017-05-15 DIAGNOSIS — Y93E2 Activity, laundry: Secondary | ICD-10-CM | POA: Insufficient documentation

## 2017-05-15 DIAGNOSIS — Y999 Unspecified external cause status: Secondary | ICD-10-CM | POA: Insufficient documentation

## 2017-05-15 DIAGNOSIS — S060X0A Concussion without loss of consciousness, initial encounter: Secondary | ICD-10-CM | POA: Insufficient documentation

## 2017-05-15 NOTE — ED Triage Notes (Signed)
Per EMS pt slipped on wet floor causing hitting hitting left forehead; no LOC.

## 2017-05-15 NOTE — ED Provider Notes (Signed)
[facility]-EMERGENCY DEPT Provider Note   CSN: [id number] Arrival date & time: 05/15/17  1304     History   Chief Complaint Chief Complaint  Patient presents with  . Head Injury    HPI Sandra Gardner is a 61 y.o. female who presents emergency department today for fall.  Patient states that approximately 4 hours ago she was at her apartment complex and while trying to take her clothes to the washing machine, she slipped on wet floor and hit her head against the wooden door.  She denies any loss of consciousness.  She notes that she has a small bruise to the left forehead.  She notes she has a mild, 1-2/10, generalized headache.  She has not taken anything for this.  She denies any nausea, vomiting, amnesia, cognitive or memory dysfunction, neck pain, visual changes, dizziness/vertigo, weakness of any of the extremities, numbness/tingling of any the extremities, loss of bowel or bladder function. No alcohol or drug use prior to the event. She is not on anticoagulation therapy.  No history of brain bleeds.  HPI  Past Medical History:  Diagnosis Date  . Cystocele   . Urinary incontinence   . Vaginal atrophy     Patient Active Problem List   Diagnosis Date Noted  . Special screening for malignant neoplasms, colon     Past Surgical History:  Procedure Laterality Date  . CESAREAN SECTION     3 previous  . CHOLECYSTECTOMY    . COLONOSCOPY N/A 10/18/2016   Procedure: COLONOSCOPY;  Surgeon: West Bali, MD;  Location: [facility];  Service: Endoscopy;  Laterality: N/A;  1230   . ENDOMETRIAL ABLATION  2012  . TUBAL LIGATION     BTL-  10-979    OB History    Gravida Para Term Preterm AB Living   4 3     1 3    SAB TAB Ectopic Multiple Live Births     1             Home Medications    Prior to Admission medications   Medication Sig Start Date End Date Taking? Authorizing Provider  naproxen sodium (ANAPROX) 220 MG tablet Take 220 mg by mouth as  needed (pain).    [provider]  vitamin E 400 UNIT capsule Take 400 Units by mouth daily.    [provider]    Family History Family History  Problem Relation Age of Onset  . Diabetes Father   . Hypertension Paternal Grandmother   . Thyroid disease Sister     Social History Social History  Substance Use Topics  . Smoking status: Never Smoker  . Smokeless tobacco: Never Used  . Alcohol use No     Allergies   Patient has no known allergies.   Review of Systems Review of Systems  All other systems reviewed and are negative.    Physical Exam Updated Vital Signs BP (!) 169/90 (BP Location: Left Arm)   Pulse 62   Temp 97.9 F (36.6 C) (Oral)   Resp 18   SpO2 99%   Physical Exam  Constitutional: She appears well-developed and well-nourished.  Non-toxic appearing  HENT:  Head: Normocephalic and atraumatic. Head is without raccoon's eyes and without Battle's sign.  Right Ear: Hearing, tympanic membrane, external ear and ear canal normal. Tympanic membrane is not perforated and not erythematous. No hemotympanum.  Left Ear: Hearing, tympanic membrane, external ear and ear canal normal. Tympanic membrane is not perforated  and not erythematous. No hemotympanum.  Nose: Nose normal. No rhinorrhea. Right sinus exhibits no maxillary sinus tenderness and no frontal sinus tenderness. Left sinus exhibits no maxillary sinus tenderness and no frontal sinus tenderness.  Mouth/Throat: Uvula is midline, oropharynx is clear and moist and mucous membranes are normal.  No CSF otorrhea.  No palpable open or depressed skull fractures.  There is a 1 cm x 2 cm small hematoma on the left upper forehead.  Eyes: Pupils are equal, round, and reactive to light. Conjunctivae, EOM and lids are normal. Right eye exhibits no discharge. Left eye exhibits no discharge. Right conjunctiva is not injected. Left conjunctiva is not injected. No scleral icterus. Right eye exhibits normal  extraocular motion and no nystagmus. Left eye exhibits normal extraocular motion and no nystagmus.  Neck: Trachea normal, normal range of motion, full passive range of motion without pain and phonation normal. Neck supple. No spinous process tenderness and no muscular tenderness present. No neck rigidity. No tracheal deviation and normal range of motion present.  Cardiovascular: Normal rate, regular rhythm, normal heart sounds and intact distal pulses.   Pulses:      Radial pulses are 2+ on the right side, and 2+ on the left side.       Dorsalis pedis pulses are 2+ on the right side, and 2+ on the left side.       Posterior tibial pulses are 2+ on the right side, and 2+ on the left side.  Pulmonary/Chest: Effort normal and breath sounds normal. No respiratory distress.  Neurological: She is alert. She has normal strength and normal reflexes. No cranial nerve deficit or sensory deficit. She displays a negative Romberg sign. Coordination and gait normal.  Reflex Scores:      Bicep reflexes are 2+ on the right side and 2+ on the left side.      Patellar reflexes are 2+ on the right side and 2+ on the left side.      Achilles reflexes are 2+ on the right side and 2+ on the left side. Mental Status: Alert, oriented, thought content appropriate, able to give a coherent history. Speech fluent without evidence of aphasia. Able to follow 2 step commands without difficulty. Cranial Nerves: II: Peripheral visual fields grossly normal, pupils equal, round, reactive to light III,IV, VI: ptosis not present, extra-ocular motions intact bilaterally V,VII: smile symmetric, eyebrows raise symmetric, facial light touch sensation equal VIII: hearing grossly normal to voice X: uvula elevates symmetrically XI: bilateral shoulder shrug symmetric and strong XII: midline tongue extension without fassiculations Motor: Normal tone. 5/5 in upper and lower extremities bilaterally including strong and equal grip  strength and dorsiflexion/plantar flexion Sensory: Sensation intact to light touch in all extremities.Negative Romberg.  Deep Tendon Reflexes: 2+ and symmetric in the biceps and patella Cerebellar: normal finger-to-nose with bilateral upper extremities. Normal heel-to -shin balance bilaterally of the lower extremity. No pronator drift.  Gait: normal gait and balance CV: distal pulses palpable throughout   Skin: She is not diaphoretic. No pallor.  Psychiatric: She has a normal mood and affect.  Nursing note and vitals reviewed.    ED Treatments / Results  Labs (all labs ordered are listed, but only abnormal results are displayed) Labs Reviewed - No data to display  EKG  EKG Interpretation None       Radiology No results found.  Procedures Procedures (including critical care time)  Medications Ordered in ED Medications - No data to display   Initial Impression /  Assessment and Plan / ED Course  I have reviewed the triage vital signs and the nursing notes.  Pertinent labs & imaging results that were available during my care of the patient were reviewed by me and considered in my medical decision making (see chart for details).     Patient with head injury which did not cause of loss of consciousness but with persistent headache since the initial trauma.  No evidence of skull fracture on physical exam. Patient is not taking anticoagulants, is less than 65 and has no history of subarachnoid or subdural hemorrhage. Patient denies nausea, vomiting, amnesia, vision changes,cognitive or memory dysfunction and vertigo.  Patient with no focal neurological deficits on physical exam. Small contusion that will be treated with ice. Discussed thoroughly symptoms to return to the emergency department including severe headaches, disequilibrium, vomiting, double vision, extremity weakness, difficulty ambulating, or any other concerning symptoms.  Discussed the likely etiology of patient's  symptoms being concussive in nature.  Discussed the risk versus benefit of CT scan at this time I do not believe she warrants one. Patient agrees that CT is not indicated at this time.  Patient will be discharged with information pertaining to diagnosis and advised to use over-the-counter medications like NSAIDs and Tylenol for pain relief. Pt has also advised to not participate in high risk activities until they are completely asymptomatic for at least 1 week or they are cleared by their doctor.   Final Clinical Impressions(s) / ED Diagnoses   Final diagnoses:  Concussion without loss of consciousness, initial encounter  Contusion of face, initial encounter    New Prescriptions New Prescriptions   No medications on file     Princella Pellegrini 05/15/17 1553    Benjiman Core, MD 05/15/17 [phone]

## 2017-05-15 NOTE — Discharge Instructions (Signed)
Please read and follow all provided instructions.  You have been diagnosed with a contusion- also known as a bruise. Apply ice pack to area of pain for 15 minutes at a time, every 2-3 hours while awake over the next few days Elevate your extremity above heart level as much as possible to reduce swelling. Return to the ER if pain persists or symptoms get worse.  Please read and follow all provided instructions.  Your diagnoses also today includes: Concussion   Tests performed today include:  Vital signs. See below for your results today.   Home care instructions:  A concussion is a brain injury from a direct hit (blow) to the head or body or a jolt of the head or neck that causes the brain to move back and forth inside the skull (such as in a car crash). This blow causes the brain to shake quickly back and forth inside the skull. This can damage brain cells and cause chemical changes in the brain. A concussion may also be known as a mild traumatic brain injury (TBI). Concussions are usually not life-threatening, but the effects of a concussion can be serious. If you have a concussion, you are more likely to experience concussion-like symptoms after a direct blow to the head in the future.  Symptoms are usually temporary, but they may last for days, weeks, or even longer. Some symptoms may appear right away but other symptoms may not show up for hours or days. Every head injury is different. Symptoms may include Headaches. This can include a feeling of pressure in the head. Memory problems. Trouble concentrating, organizing, or making decisions. Slowness in thinking, acting or reacting, speaking, or reading. Confusion. Fatigue. Changes in eating or sleeping patterns. Problems with coordination or balance. Nausea or vomiting. Numbness or tingling. Sensitivity to light or noise. Vision or hearing problems. Reduced sense of smell. Irritability or mood changes. Dizziness. Lack of  motivation. Seeing or hearing things that other people do not see or hear (hallucinations).   Please avoid alcohol for the next week.  Please rest and drink plenty of water.  We recommend that you avoid any activity that may lead to another head injury for at least 1 week and until you are cleared by your physician at follow up. We also recommend "brain rest" - please avoid TV, cell phones, tablets, computers as much as possible for the next 48 hours.   Follow-up instructions: Please follow-up with your primary care provider this week for further evaluation of symptoms and treatment   Return instructions:  Please return to the Emergency Department if you do not get better, if you get worse, or new symptoms OR If you develop severe headaches, disequilibrium/difficulty walking, double vision, difficulty concentrating, sensitivity to light, changes in mood, nausea/vomiting, ongoing dizziness you can return for re-evaluation. Please return if you have any other emergent concerns.  Additional Information:  Your vital signs today were: BP (!) 169/90 (BP Location: Left Arm)    Pulse 62    Temp 97.9 F (36.6 C) (Oral)    Resp 18    SpO2 99%  If your blood pressure (BP) was elevated above 135/85 this visit, please have this repeated by your doctor within one month. ---------------

## 2017-07-11 ENCOUNTER — Encounter (HOSPITAL_COMMUNITY): Payer: Self-pay | Admitting: Emergency Medicine

## 2017-07-11 ENCOUNTER — Emergency Department (HOSPITAL_COMMUNITY)
Admission: EM | Admit: 2017-07-11 | Discharge: 2017-07-11 | Disposition: A | Discharge status: Home / Self Care | Payer: Self-pay | Attending: Emergency Medicine | Admitting: Emergency Medicine

## 2017-07-11 ENCOUNTER — Other Ambulatory Visit: Payer: Self-pay

## 2017-07-11 DIAGNOSIS — L719 Rosacea, unspecified: Secondary | ICD-10-CM | POA: Insufficient documentation

## 2017-07-11 DIAGNOSIS — Z79899 Other long term (current) drug therapy: Secondary | ICD-10-CM | POA: Insufficient documentation

## 2017-07-11 MED ORDER — METRONIDAZOLE 0.75 % EX GEL: 1.0000 "application " | Freq: Two times a day (BID) | CUTANEOUS | 0 refills | Status: DC

## 2017-07-11 MED ORDER — FLUTICASONE PROPIONATE 50 MCG/ACT NA SUSP: 1.0000 | Freq: Every day | NASAL | 2 refills | Status: DC

## 2017-07-11 NOTE — ED Provider Notes (Signed)
[facility]   CSN: [id number] Arrival date & time: 07/11/17  1807     History   Chief Complaint Chief Complaint  Patient presents with  . Rash    HPI Sandra Gardner is a 61 y.o. female a history of rosacea who presents the emergency department complaining of a progressively worsening facial rash that started 2 days ago.  Patient states she has a history of rosacea previously treated with a topical antibiotic cream.  States that since this diagnosis 2 years prior her face generally has some mild erythema.  2 days prior erythema worsened, and pustules appeared similar to previous.  She tried treating this with a OTC acne cream without relief.  States she ran out of the antibiotic cream that she was previously prescribed.  Describes the rash as red and itchy.  Denies rash in any other location, fever, chills, difficulty breathing, or for sensation of throat closing. Translator line used throughout visit   HPI  Past Medical History:  Diagnosis Date  . Cystocele   . Urinary incontinence   . Vaginal atrophy     Patient Active Problem List   Diagnosis Date Noted  . Special screening for malignant neoplasms, colon     Past Surgical History:  Procedure Laterality Date  . CESAREAN SECTION     3 previous  . CHOLECYSTECTOMY    . COLONOSCOPY N/A 10/18/2016   Procedure: COLONOSCOPY;  Surgeon: West BaliSandi L Fields, MD;  Location: [facility];  Service: Endoscopy;  Laterality: N/A;  1230   . ENDOMETRIAL ABLATION  2012  . SHOULDER SURGERY Right   . TUBAL LIGATION     BTL-  10-979-  02-1984    OB History    Gravida Para Term Preterm AB Living   4 3     1 3    SAB TAB Ectopic Multiple Live Births     1             Home Medications    Prior to Admission medications   Medication Sig Start Date End Date Taking? Authorizing Provider  naproxen sodium (ANAPROX) 220 MG tablet Take 220 mg by mouth as needed (pain).   Yes [provider]    vitamin E 400 UNIT capsule Take 400 Units by mouth daily.   Yes [provider]  fluticasone (FLONASE) 50 MCG/ACT nasal spray Place 1 spray into both nostrils daily. 07/11/17   Teng Decou R, PA-C  metroNIDAZOLE (METROGEL) 0.75 % gel Apply 1 application topically 2 (two) times daily. 07/11/17   Daijon Wenke, Pleas KochSamantha R, PA-C    Family History Family History  Problem Relation Age of Onset  . Diabetes Father   . Hypertension Paternal Grandmother   . Thyroid disease Sister     Social History Social History   Tobacco Use  . Smoking status: Never Smoker  . Smokeless tobacco: Never Used  Substance Use Topics  . Alcohol use: No  . Drug use: No     Allergies   Patient has no known allergies.   Review of Systems Review of Systems  Constitutional: Negative for chills and fever.  HENT: Positive for congestion. Negative for drooling, ear pain, sinus pain and sore throat.   Eyes: Negative for visual disturbance.  Respiratory: Negative for cough and shortness of breath.   Cardiovascular: Negative for chest pain.  Gastrointestinal: Negative for nausea and vomiting.  Skin: Positive for rash.  Neurological: Negative for dizziness, weakness, light-headedness, numbness and headaches.  All other  systems reviewed and are negative.   Physical Exam Updated Vital Signs BP (!) 145/73 (BP Location: Left Arm)   Pulse 63   Temp 98.1 F (36.7 C) (Oral)   Resp 18   Ht 5' (1.524 m)   Wt 65.8 kg (145 lb)   SpO2 98%   BMI 28.32 kg/m   Physical Exam  Constitutional: She appears well-developed and well-nourished.  Non-toxic appearance. No distress.  HENT:  Head: Normocephalic and atraumatic.  Right Ear: Tympanic membrane normal.  Left Ear: Tympanic membrane normal.  Mouth/Throat: Uvula is midline and oropharynx is clear and moist. No posterior oropharyngeal edema or posterior oropharyngeal erythema.  Nose: nasal congestion present  Eyes: Conjunctivae are normal. Pupils  are equal, round, and reactive to light. Right eye exhibits no discharge. Left eye exhibits no discharge.  Neck: Normal range of motion. Neck supple.  Cardiovascular: Normal rate and regular rhythm.  Pulmonary/Chest: Effort normal and breath sounds normal. No stridor. No respiratory distress. She has no wheezes. She has no rhonchi. She has no rales.  Abdominal: Soft. She exhibits no distension.  Lymphadenopathy:    She has no cervical adenopathy.  Neurological: She is alert.  Clear speech.   Skin:  There is an erythematous rash over bilateral cheeks with very small pustules scattered to bilateral cheeks.  No nodules, vesicles, or fluctuant areas.  No warmth.  Psychiatric: She has a normal mood and affect. Her behavior is normal. Thought content normal.  Nursing note and vitals reviewed.   ED Treatments / Results  Labs (all labs ordered are listed, but only abnormal results are displayed) Labs Reviewed - No data to display  EKG  EKG Interpretation None       Radiology No results found.  Procedures Procedures (including critical care time)  Medications Ordered in ED Medications - No data to display   Initial Impression / Assessment and Plan / ED Course  I have reviewed the triage vital signs and the nursing notes.  Pertinent labs & imaging results that were available during my care of the patient were reviewed by me and considered in my medical decision making (see chart for details).  Patient presents with facial rash consistent with rosacea.  She is nontoxic appearing and in no apparent distress.  Blood pressure initially elevated in the emergency department, this has improved.  There are no signs of respiratory distress, airway is patent, no stridor, patient is handling secretions without difficulty and denies feeling of throat closing.  No angioedema. There are no vesicles, no warmth, no appreciable abscess, no bullous impetigo, no desquamation, no target lesions with  dusky purpura or a central bulla. Not tender to touch. No concern for superimposed infection. No concern for SJS, TEN, TSS, tick borne illness, syphilis or other life-threatening condition.  Will treat with topical metronidazole and dermatology follow-up.  Will also prescribed Flonase for nasal congestion.  Discussed patient's elevated blood pressure and need for primary care follow-up for reevaluation and possible initiation of antihypertensive medicines.  I discussed results, treatment plan, need for dermatology follow-up, and return precautions with the patient including hypertensive emergency return precuations. Provided opportunity for questions, patient confirmed understanding and is in agreement with plan.     Vitals:   07/11/17 1855 07/11/17 2226  BP: (!) 173/86 (!) 145/73  Pulse: 61 63  Resp: 18 18  Temp: 98.4 F (36.9 C) 98.1 F (36.7 C)  SpO2: 95% 98%   Final Clinical Impressions(s) / ED Diagnoses   Final diagnoses:  Rosacea    ED Discharge Orders        Ordered    metroNIDAZOLE (METROGEL) 0.75 % gel  2 times daily     07/11/17 2214    fluticasone (FLONASE) 50 MCG/ACT nasal spray  Daily     07/11/17 2214       Cherly Anderson, PA-C 07/11/17 2321    Lorre Nick, MD 07/11/17 [phone]

## 2017-07-11 NOTE — Discharge Instructions (Signed)
You were seen in the emergency department and diagnosed with rosacea.  I have prescribed you a topical antibiotic cream you will need to put this on in a thin layer in the morning and again at night.  I have also provided you with information for a dermatologist, you will need to follow-up with their office in 1 week for reevaluation, you will need to call to make an appointment.  I have prescribed you Flonase which is a nasal spray for your nasal congestion, use this 1 spray in each nare each morning.  Your blood pressure was noted to be elevated in the emergency department today will need to follow-up with your primary care doctor to recheck this and possibly start an antihypertensive medication.  If you start to experience headaches, changes in your vision, chest pain, shortness of breath, numbness, or weakness return to the emergency department.  Return to the emergency department for any new or worsening symptoms.   English to [place] google translation Usted fue atendido en el servicio de urgencias y diagnosticado con roscea. Le he recetado una crema antibitica tpica que deber ponerse en una capa delgada por la maana y nuevamente por la noche. Tambin le proporcion informacin para TEFL teacherun dermatlogo, deber hacer un seguimiento en su oficina en 1 semana para reevaluarlo, deber llamar para hacer una cita. Le prescrib Flonase, que es un aerosol nasal para su congestin nasal, use este aerosol en cada nara cada maana. Se not que su presin arterial se elev en el departamento de emergencias. Hoy tendr Gap Incque hacer un seguimiento con su mdico de atencin primaria para volver a Investment banker, operationalverificar esto y Management consultantposiblemente comenzar a tomar un medicamento antihipertensivo. Si comienza a experimentar dolores de cabeza, cambios en su visin, dolor en el pecho, falta de aliento, entumecimiento o debilidad, regrese al departamento de emergencias. Regrese al departamento de emergencias para cualquier sntoma nuevo o que  empeore.

## 2017-07-11 NOTE — ED Triage Notes (Signed)
Through interpreter, patient appears with red rash to bilateral cheeks pt diagnosed with rocesea states she applied cream 2 days ago for acne and is having worsening redness and itching. No difficulty with airway or breathing.

## 2017-07-13 ENCOUNTER — Encounter (HOSPITAL_COMMUNITY): Payer: Self-pay | Admitting: *Deleted

## 2017-07-13 ENCOUNTER — Emergency Department (HOSPITAL_COMMUNITY)
Admission: EM | Admit: 2017-07-13 | Discharge: 2017-07-13 | Disposition: A | Discharge status: Home / Self Care | Payer: Self-pay | Attending: Emergency Medicine | Admitting: Emergency Medicine

## 2017-07-13 DIAGNOSIS — R21 Rash and other nonspecific skin eruption: Secondary | ICD-10-CM | POA: Insufficient documentation

## 2017-07-13 DIAGNOSIS — L239 Allergic contact dermatitis, unspecified cause: Secondary | ICD-10-CM

## 2017-07-13 DIAGNOSIS — T490X5A Adverse effect of local antifungal, anti-infective and anti-inflammatory drugs, initial encounter: Secondary | ICD-10-CM | POA: Insufficient documentation

## 2017-07-13 DIAGNOSIS — Z79899 Other long term (current) drug therapy: Secondary | ICD-10-CM | POA: Insufficient documentation

## 2017-07-13 DIAGNOSIS — R220 Localized swelling, mass and lump, head: Secondary | ICD-10-CM | POA: Insufficient documentation

## 2017-07-13 MED ORDER — PREDNISONE 10 MG (21) PO TBPK: ORAL_TABLET | ORAL | 0 refills | Status: DC

## 2017-07-13 MED ORDER — METHYLPREDNISOLONE SODIUM SUCC 125 MG IJ SOLR
80.0000 mg | Freq: Once | INTRAMUSCULAR | Status: AC
  Administered 2017-07-13: 80 mg via INTRAMUSCULAR
  Filled 2017-07-13: qty 2

## 2017-07-13 MED ORDER — HYDROXYZINE HCL 25 MG PO TABS: 25.0000 mg | ORAL_TABLET | Freq: Three times a day (TID) | ORAL | 0 refills | Status: DC | PRN

## 2017-07-13 NOTE — ED Notes (Signed)
Family at bedside. 

## 2017-07-13 NOTE — ED Triage Notes (Signed)
Pt complains of rash to bilateral cheeks and hands. Pt was seen 2 days ago for same and prescribed a cream. Pt feels the cream has made her face appear more swollen.

## 2017-07-13 NOTE — ED Notes (Signed)
Bed: WTR6 Expected date:  Expected time:  Means of arrival:  Comments: 

## 2017-07-13 NOTE — Discharge Instructions (Signed)
Follow up with the dermatologist as discussed at your last visit.  The medication for itching can make you sleepy so not drive while taking it.

## 2017-07-13 NOTE — ED Provider Notes (Signed)
[facility]-EMERGENCY DEPT Provider Note   CSN: [id number] Arrival date & time: 07/13/17  [id number]     History   Chief Complaint Chief Complaint  Patient presents with  . Rash    HPI Sandra Gardner is a 61 y.o. female who presents to the ED with a rash. Patient reports that she was evaluated 3 days ago for same and started on Metrogel. She has been using as directed but the rash continues to be just as bad. Patient did not follow up with  Dermatology. She called yesterday but reports that no one answered. Patient reports that today her face is swollen and and she has itching in her face and hands.   HPI  Past Medical History:  Diagnosis Date  . Cystocele   . Urinary incontinence   . Vaginal atrophy     Patient Active Problem List   Diagnosis Date Noted  . Special screening for malignant neoplasms, colon     Past Surgical History:  Procedure Laterality Date  . CESAREAN SECTION     3 previous  . CHOLECYSTECTOMY    . COLONOSCOPY N/A 10/18/2016   Procedure: COLONOSCOPY;  Surgeon: West BaliSandi L Fields, MD;  Location: [facility];  Service: Endoscopy;  Laterality: N/A;  1230   . ENDOMETRIAL ABLATION  2012  . SHOULDER SURGERY Right   . TUBAL LIGATION     BTL-  [id number]-  02-1984    OB History    Gravida Para Term Preterm AB Living   4 3     1 3    SAB TAB Ectopic Multiple Live Births     1             Home Medications    Prior to Admission medications   Medication Sig Start Date End Date Taking? Authorizing Provider  fluticasone (FLONASE) 50 MCG/ACT nasal spray Place 1 spray into both nostrils daily. 07/11/17   Petrucelli, Samantha R, PA-C  hydrOXYzine (ATARAX/VISTARIL) 25 MG tablet Take 1 tablet (25 mg total) by mouth every 8 (eight) hours as needed for itching. 07/13/17   Janne NapoleonNeese, Hope M, NP  metroNIDAZOLE (METROGEL) 0.75 % gel Apply 1 application topically 2 (two) times daily. 07/11/17   Petrucelli, Samantha R, PA-C  naproxen sodium (ANAPROX) 220 MG tablet Take  220 mg by mouth as needed (pain).    [provider]  predniSONE (STERAPRED UNI-PAK 21 TAB) 10 MG (21) TBPK tablet Starting tomorrow 07/14/17 take 6 tablets day one then 5, 4, 3, 2, 1 07/13/17   Damian LeavellNeese, [place] M, NP  vitamin E 400 UNIT capsule Take 400 Units by mouth daily.    [provider]    Family History Family History  Problem Relation Age of Onset  . Diabetes Father   . Hypertension Paternal Grandmother   . Thyroid disease Sister     Social History Social History   Tobacco Use  . Smoking status: Never Smoker  . Smokeless tobacco: Never Used  Substance Use Topics  . Alcohol use: No  . Drug use: No     Allergies   Patient has no known allergies.   Review of Systems Review of Systems  Constitutional: Negative for chills and fever.  HENT: Negative.   Eyes: Negative for discharge, redness, itching and visual disturbance.  Respiratory: Negative for cough and shortness of breath.   Cardiovascular: Negative for chest pain.  Gastrointestinal: Negative for abdominal pain, nausea and vomiting.  Genitourinary: Negative for dysuria, frequency and urgency.  Musculoskeletal: Positive for  joint swelling. Negative for neck stiffness.  Skin: Positive for rash.  Neurological: Negative for syncope and headaches.  Psychiatric/Behavioral: Negative for confusion.     Physical Exam Updated Vital Signs BP (!) 149/75 (BP Location: Left Arm)   Pulse 62   Temp 98.7 F (37.1 C) (Oral)   Resp 16   SpO2 99%   Physical Exam  Constitutional: She appears well-developed and well-nourished. No distress.  HENT:  Nose: Nose normal.  Mouth/Throat: Uvula is midline, oropharynx is clear and moist and mucous membranes are normal.  Rash and swelling of the face.   Eyes: Conjunctivae and EOM are normal. Pupils are equal, round, and reactive to light.  Neck: Normal range of motion. Neck supple.  Cardiovascular: Normal rate and regular rhythm.  Pulmonary/Chest: Effort normal  and breath sounds normal.  Musculoskeletal: Normal range of motion.  Neurological: She is alert.  Skin: Rash noted.  Rash to face and left hand.  Psychiatric: She has a normal mood and affect. Her behavior is normal.  Nursing note and vitals reviewed.    ED Treatments / Results  Labs (all labs ordered are listed, but only abnormal results are displayed) Labs Reviewed - No data to display Radiology No results found.  Procedures Procedures (including critical care time)  Medications Ordered in ED Medications  methylPREDNISolone sodium succinate (SOLU-MEDROL) 125 mg/2 mL injection 80 mg (not administered)     Initial Impression / Assessment and Plan / ED Course  I have reviewed the triage vital signs and the nursing notes. 61 y.o. female with rash that has gotten worse since starting cream a few days ago. Discussed with the patient to stop the cream and take the medications we prescribe and f/u with dermatology as planned. Patient stable for d/c without respiratory symptoms and no difficulty swallowing. Injection of steroid here and steroids PO Rx, atarax for itching.  Dr. Fayrene FearingJames in to see the patient and discuss plan of care.  Final Clinical Impressions(s) / ED Diagnoses   Final diagnoses:  Allergic dermatitis    ED Discharge Orders        Ordered    hydrOXYzine (ATARAX/VISTARIL) 25 MG tablet  Every 8 hours PRN     07/13/17 1225    predniSONE (STERAPRED UNI-PAK 21 TAB) 10 MG (21) TBPK tablet     07/13/17 1225       Damian Leavelleese, [place] M, [place] 07/13/17 1231    Rolland PorterJames, Mark, MD 07/13/17 [id number]

## 2017-07-18 ENCOUNTER — Encounter: Payer: Self-pay | Admitting: Family Medicine

## 2017-07-18 ENCOUNTER — Ambulatory Visit: Payer: Self-pay | Attending: Family Medicine | Admitting: Family Medicine

## 2017-07-18 VITALS — BP 147/74 | HR 70 | Temp 98.3°F | Resp 16 | Ht <= 58 in | Wt 124.4 lb

## 2017-07-18 DIAGNOSIS — Z79899 Other long term (current) drug therapy: Secondary | ICD-10-CM | POA: Insufficient documentation

## 2017-07-18 DIAGNOSIS — Z09 Encounter for follow-up examination after completed treatment for conditions other than malignant neoplasm: Secondary | ICD-10-CM

## 2017-07-18 DIAGNOSIS — Z872 Personal history of diseases of the skin and subcutaneous tissue: Secondary | ICD-10-CM

## 2017-07-18 DIAGNOSIS — Z13220 Encounter for screening for lipoid disorders: Secondary | ICD-10-CM

## 2017-07-18 DIAGNOSIS — L239 Allergic contact dermatitis, unspecified cause: Secondary | ICD-10-CM | POA: Insufficient documentation

## 2017-07-18 DIAGNOSIS — I10 Essential (primary) hypertension: Secondary | ICD-10-CM | POA: Insufficient documentation

## 2017-07-18 MED ORDER — AMLODIPINE BESYLATE 5 MG PO TABS: 5.0000 mg | ORAL_TABLET | Freq: Every day | ORAL | 2 refills | Status: DC

## 2017-07-18 NOTE — Patient Instructions (Signed)
Hipertensión  Hypertension  La hipertensión, conocida comúnmente como presión arterial alta, se produce cuando la sangre bombea en las arterias con mucha fuerza. Las arterias son los vasos sanguíneos que transportan la sangre desde el corazón al resto del cuerpo. La hipertensión hace que el corazón haga más esfuerzo para bombear sangre y puede provocar que las arterias se estrechen o endurezcan. La hipertensión no tratada o no controlada puede causar infarto de miocardio, accidentes cerebrovasculares, enfermedad renal y otros problemas.  Una lectura de la presión arterial consiste de un número más alto sobre un número más bajo. En condiciones ideales, la presión arterial debe estar por debajo de 120/80. El primer número ("superior") es la presión sistólica. Es la medida de la presión de las arterias cuando el corazón late. El segundo número ("inferior") es la presión diastólica. Es la medida de la presión en las arterias cuando el corazón se relaja.  ¿Cuáles son las causas?  Se desconoce la causa de esta afección.  ¿Qué incrementa el riesgo?  Algunos factores de riesgo de hipertensión están bajo su control. Otros no.  Factores que puede modificar  · Fumar.  · Tener diabetes mellitus tipo 2, colesterol alto, o ambos.  · No hacer la cantidad suficiente de actividad física o ejercicio.  · Tener sobrepeso.  · Consumir mucha grasa, azúcar, calorías o sal (sodio) en su dieta.  · Beber alcohol en exceso.  Factores que son difíciles o imposibles de modificar  · Tener enfermedad renal crónica.  · Tener antecedentes familiares de presión arterial alta.  · La edad. Los riesgos aumentan con la edad.  · La raza. El riesgo es mayor para las personas afroamericanas.  · El sexo. Antes de los 45 años, los hombres corren más riesgo que las mujeres. Después de los 65 años, las mujeres corren más riesgo que los hombres.  · Tener apnea obstructiva del sueño.  · El estrés.  ¿Cuáles son los signos o los síntomas?   La presión arterial extremadamente alta (crisis hipertensiva) puede provocar:  · Dolor de cabeza.  · Ansiedad.  · Falta de aire.  · Hemorragia nasal.  · Náuseas y vómitos.  · Dolor de pecho intenso.  · Una crisis de movimientos que no puede controlar (convulsiones).    ¿Cómo se diagnostica?  Esta afección se diagnostica midiendo su presión arterial mientras se encuentra sentado, con el brazo apoyado sobre una superficie. El brazalete del tensiómetro debe colocarse directamente sobre la piel de la parte superior del brazo y al nivel de su corazón. Debe medirla al menos dos veces en el mismo brazo. Determinadas condiciones pueden causar una diferencia de presión arterial entre el brazo izquierdo y el derecho.  Ciertos factores pueden provocar que las lecturas de la presión arterial sean inferiores o superiores a lo normal (elevadas) por un período corto de tiempo:  · Si su presión arterial es más alta cuando se encuentra en el consultorio del médico que cuando la mide en su hogar, se denomina "hipertensión de bata blanca". La mayoría de las personas que tienen esta afección no deben ser medicadas.  · Si su presión arterial es más alta en el hogar que cuando se encuentra en el consultorio del médico, se denomina "hipertensión enmascarada". La mayoría de las personas que tienen esta afección deben ser medicadas para controlar la presión arterial.    Si tiene una lecturas de presión arterial alta durante una visita o si tiene presión arterial normal con otros factores de riesgo:  · Es posible que se le   pida que regrese otro día para volver a controlar su presión arterial.  · Se le puede pedir que se controle la presión arterial en su casa durante 1 semana o más.    Si se le diagnostica hipertensión, es posible que se le realicen otros análisis de sangre o estudios de diagnóstico por imágenes para ayudar a su médico a comprender su riesgo general de tener otras afecciones.  ¿Cómo se trata?   Esta afección se trata haciendo cambios saludables en el estilo de vida, tales como ingerir alimentos saludables, realizar más ejercicio y reducir el consumo de alcohol. El médico puede recetarle medicamentos si los cambios en el estilo de vida no son suficientes para lograr controlar la presión arterial y si:  · Su presión arterial sistólica está por encima de 130.  · Su presión arterial diastólica está por encima de 80.    La presión arterial deseada puede variar en función de las enfermedades, la edad y otros factores personales.  Siga estas instrucciones en su casa:  Comida y bebida  · Siga una dieta con alto contenido de fibras y potasio, y con bajo contenido de sodio, azúcar agregada y grasas. Un ejemplo de plan alimenticio es la dieta DASH (Dietary Approaches to Stop Hypertension, Métodos alimenticios para detener la hipertensión). Para alimentarse de esta manera:  ? Coma mucha fruta y verdura fresca. Trate de que la mitad del plato de cada comida sea de frutas y verduras.  ? Coma cereales integrales, como pasta integral, arroz integral y pan integral. Llene aproximadamente un cuarto del plato con cereales integrales.  ? Coma y beba productos lácteos con bajo contenido de grasa, como leche descremada o yogur bajo en grasas.  ? Evite la ingesta de cortes de carne grasa, carne procesada o curada, y carne de ave con piel. Llene aproximadamente un cuarto del plato con proteínas magras, como pescado, pollo sin piel, frijoles, huevos y tofu.  ? Evite ingerir alimentos prehechos o procesados. En general, estos tienen mayor cantidad de sodio, azúcar agregada y grasa.  · Reduzca su ingesta diaria de sodio. La mayoría de las personas que tienen hipertensión deben comer menos de 1500 mg de sodio por día.  · Limite el consumo de alcohol a no más de 1 medida por día si es mujer y no está embarazada y a 2 medidas por día si es hombre. Una medida equivale  a 12 onzas de cerveza, 5 onzas de vino o 1½ onzas de bebidas alcohólicas de alta graduación.  Estilo de vida  · Trabaje con su médico para mantener un peso saludable o perder peso. Pregúntele cual es su peso recomendado.  · Realice al menos 30 minutos de ejercicio que haga que se acelere su corazón (ejercicio aeróbico) la mayoría de los días de la semana. Estas actividades pueden incluir caminar, nadar o andar en bicicleta.  · Incluya ejercicios para fortalecer sus músculos (ejercicios de resistencia), como pilates o levantamiento de pesas, como parte de su rutina semanal de ejercicios. Intente realizar 30 minutos de este tipo de ejercicios al menos tres días a la semana.  · No consuma ningún producto que contenga nicotina o tabaco, como cigarrillos y cigarrillos electrónicos. Si necesita ayuda para dejar de fumar, consulte al médico.  · Contrólese la presión arterial en su casa según las indicaciones del médico.  · Concurra a todas las visitas de control como se lo haya indicado el médico. Esto es importante.  Medicamentos  · Tome los medicamentos de venta libre y los recetados solamente como se   lo haya indicado el médico. Siga cuidadosamente las indicaciones. Los medicamentos para la presión arterial deben tomarse según las indicaciones.  · No omita las dosis de medicamentos para la presión arterial. Si lo hace, estará en riesgo de tener problemas y puede hacer que los medicamentos sean menos eficaces.  · Pregúntele a su médico a qué efectos secundarios o reacciones a los medicamentos debe prestar atención.  Comuníquese con un médico si:  · Piensa que tiene una reacción a un medicamento que está tomando.  · Tiene dolores de cabeza frecuentes (recurrentes).  · Siente mareos.  · Tiene hinchazón en los tobillos.  · Tiene problemas de visión.  Solicite ayuda de inmediato si:  · Siente un dolor de cabeza intenso o confusión.  · Siente debilidad inusual o adormecimiento.  · Siente que va a desmayarse.   · Siente un dolor intenso en el pecho o el abdomen.  · Vomita repetidas veces.  · Tiene dificultad para respirar.  Resumen  · La hipertensión se produce cuando la sangre bombea en las arterias con mucha fuerza. Si esta afección no se controla, podría correr riesgo de tener complicaciones graves.  · La presión arterial deseada puede variar en función de las enfermedades, la edad y otros factores personales. Para la mayoría de las personas, una presión arterial normal es menor que 120/80.  · La hipertensión se trata con cambios en el estilo de vida, medicamentos o una combinación de ambos. Los cambios en el estilo de vida incluyen pérdida de peso, ingerir alimentos sanos, seguir una dieta baja en sodio, hacer más ejercicio y limitar el consumo de alcohol.  Esta información no tiene como fin reemplazar el consejo del médico. Asegúrese de hacerle al médico cualquier pregunta que tenga.  Document Released: 07/09/2005 Document Revised: 06/20/2016 Document Reviewed: 06/20/2016  Elsevier Interactive Patient Education © 2018 [employer]vier Inc[employer]

## 2017-07-18 NOTE — Progress Notes (Signed)
Subjective:  Patient ID: Sandra Gardner, female    DOB: December 25, 1955  Age: 61 y.o. MRN: [id number]  CC: Hypertension   HPI Sandra Gardner presents for hypertension and follow up. Recent history of ED visits 07/11/17 and 07/13/2017 related to rash. She was recently diagnosed with allergic dermatitis. Past medication history includes rosacea. She was initially started on Metrogel with no relief of symptoms. She also began to experience facial swelling and itching. No anaphylaxis  She was given IV steroids, oral steroids and hydroxyzine.Patient was encouraged to follow up with dermatology. She reports symptoms have resolved. She denies any facial or throat swelling, itching, or rashes. She denies any new foods but suspects allergic dermatitis occurred after cleaning a home with Clorox.  History of elevated blood pressures.  She is not exercising and is adherent to low salt diet.  She does not check BP at home. Cardiac symptoms none. Patient denies chest pain, dyspnea, lower extremity edema, near-syncope and syncope.  Cardiovascular risk factors: hypertension and sedentary lifestyle. Use of agents associated with hypertension: none. History of target organ damage: none.    Outpatient Medications Prior to Visit  Medication Sig Dispense Refill  . hydrOXYzine (ATARAX/VISTARIL) 25 MG tablet Take 1 tablet (25 mg total) by mouth every 8 (eight) hours as needed for itching. 20 tablet 0  . metroNIDAZOLE (METROGEL) 0.75 % gel Apply 1 application topically 2 (two) times daily. 45 g 0  . naproxen sodium (ANAPROX) 220 MG tablet Take 220 mg by mouth as needed (pain).    . predniSONE (STERAPRED UNI-PAK 21 TAB) 10 MG (21) TBPK tablet Starting tomorrow 07/14/17 take 6 tablets day one then 5, 4, 3, 2, 1 21 tablet 0  . vitamin E 400 UNIT capsule Take 400 Units by mouth daily.    . fluticasone (FLONASE) 50 MCG/ACT nasal spray Place 1 spray into both nostrils daily. (Patient not taking: Reported on 07/18/2017) 16 g 2   No  facility-administered medications prior to visit.     ROS Review of Systems  Constitutional: Negative.   Respiratory: Negative.   Cardiovascular: Negative.   Skin: Rash: history of allergic dermatitis.       History of rosacea        Objective:  BP (!) 147/74 (BP Location: Right Arm, Cuff Size: Normal)   Pulse 70   Temp 98.3 F (36.8 C) (Oral)   Resp 16   Ht 4\' 9"  (1.448 m)   Wt 124 lb 6.4 oz (56.4 kg)   SpO2 94%   BMI 26.92 kg/m   BP/Weight 07/18/2017 07/13/2017 07/11/2017  Systolic BP 147 138 145  Diastolic BP 74 101 73  Wt. (Lbs) 124.4 - 145  BMI 26.92 - 28.32     Physical Exam  Constitutional: She appears well-developed and well-nourished.  HENT:  Head: Normocephalic and atraumatic.  Right Ear: External ear normal.  Left Ear: External ear normal.  Nose: Nose normal.  Mouth/Throat: Oropharynx is clear and moist.  Eyes: Conjunctivae are normal. Right eye exhibits no discharge. Left eye exhibits no discharge.  Neck: Normal range of motion. Neck supple.  Cardiovascular: Normal rate, regular rhythm, normal heart sounds and intact distal pulses.  Pulmonary/Chest: Effort normal and breath sounds normal.  Skin: Skin is warm and dry.  Nursing note and vitals reviewed.  Assessment & Plan:   1. Essential hypertension Schedule BP recheck in 2 weeks with nurse. If BP is greater than 90/60 (MAP 65 or greater) but not less than 130/80 may increase  dose of  amlodipine to 10 mg QD and recheck in another 2 weeks.  - CMP and Liver - Lipid Panel - amLODipine (NORVASC) 5 MG tablet; Take 1 tablet (5 mg total) by mouth daily.  Dispense: 30 tablet; Refill: 2  2. History of allergic contact dermatitis  - Ambulatory referral to Allergy  3. Follow up  - Ambulatory referral to Dermatology - Ambulatory referral to Allergy  4. History of rosacea  - Ambulatory referral to Dermatology  5. Screening cholesterol level  - Lipid Panel      Follow-up: Return in about 2  weeks (around 08/01/2017) for BP check [place].   Lizbeth BarkMandesia R Hairston FNP

## 2017-07-18 NOTE — Progress Notes (Signed)
Patient is here for a blood pressure re-check due to when she was a the Emergency room for allergic reaction. Patient stated the medication from the hospital helps her.

## 2017-07-19 LAB — LIPID PANEL
CHOL/HDL RATIO: 3.6 ratio (ref 0.0–4.4)
CHOLESTEROL TOTAL: 197 mg/dL (ref 100–199)
HDL: 55 mg/dL (ref >39)
LDL CALC: 108 mg/dL — AB (ref 0–99)
Triglycerides: 172 mg/dL — ABNORMAL HIGH (ref 0–149)
VLDL Cholesterol Cal: 34 mg/dL (ref 5–40)

## 2017-07-19 LAB — CMP AND LIVER
ALBUMIN: 4.3 g/dL (ref 3.6–4.8)
ALT: 30 IU/L (ref 0–32)
AST: 18 IU/L (ref 0–40)
Alkaline Phosphatase: 99 IU/L (ref 39–117)
BILIRUBIN TOTAL: 0.4 mg/dL (ref 0.0–1.2)
BUN: 17 mg/dL (ref 8–27)
Bilirubin, Direct: 0.11 mg/dL (ref 0.00–0.40)
CHLORIDE: 97 mmol/L (ref 96–106)
CO2: 23 mmol/L (ref 20–29)
CREATININE: 0.77 mg/dL (ref 0.57–1.00)
Calcium: 9.6 mg/dL (ref 8.7–10.3)
GFR calc Af Amer: 97 mL/min/{1.73_m2} (ref >59)
GFR calc non Af Amer: 84 mL/min/{1.73_m2} (ref >59)
Glucose: 311 mg/dL — ABNORMAL HIGH (ref 65–99)
POTASSIUM: 4.4 mmol/L (ref 3.5–5.2)
SODIUM: 135 mmol/L (ref 134–144)
TOTAL PROTEIN: 7.5 g/dL (ref 6.0–8.5)

## 2017-07-24 ENCOUNTER — Other Ambulatory Visit: Payer: Self-pay | Admitting: Family Medicine

## 2017-07-24 MED ORDER — ATORVASTATIN CALCIUM 10 MG PO TABS: 10.0000 mg | ORAL_TABLET | Freq: Every day | ORAL | 2 refills | Status: DC

## 2017-08-14 ENCOUNTER — Other Ambulatory Visit: Payer: Self-pay | Admitting: Obstetrics and Gynecology

## 2017-08-14 DIAGNOSIS — Z1231 Encounter for screening mammogram for malignant neoplasm of breast: Secondary | ICD-10-CM

## 2017-09-12 ENCOUNTER — Ambulatory Visit (HOSPITAL_COMMUNITY): Payer: Self-pay

## 2018-02-03 ENCOUNTER — Ambulatory Visit: Payer: Self-pay | Attending: Family Medicine

## 2018-02-17 ENCOUNTER — Ambulatory Visit: Payer: Self-pay | Attending: Nurse Practitioner

## 2018-03-18 ENCOUNTER — Ambulatory Visit: Payer: Self-pay | Attending: Nurse Practitioner | Admitting: Nurse Practitioner

## 2018-03-18 ENCOUNTER — Encounter: Payer: Self-pay | Admitting: Nurse Practitioner

## 2018-03-18 VITALS — BP 137/85 | HR 73 | Temp 99.1°F | Ht <= 58 in | Wt 123.2 lb

## 2018-03-18 DIAGNOSIS — Z8249 Family history of ischemic heart disease and other diseases of the circulatory system: Secondary | ICD-10-CM | POA: Insufficient documentation

## 2018-03-18 DIAGNOSIS — I10 Essential (primary) hypertension: Secondary | ICD-10-CM | POA: Insufficient documentation

## 2018-03-18 DIAGNOSIS — Z9049 Acquired absence of other specified parts of digestive tract: Secondary | ICD-10-CM | POA: Insufficient documentation

## 2018-03-18 DIAGNOSIS — L719 Rosacea, unspecified: Secondary | ICD-10-CM | POA: Insufficient documentation

## 2018-03-18 DIAGNOSIS — R7309 Other abnormal glucose: Secondary | ICD-10-CM

## 2018-03-18 DIAGNOSIS — Z79899 Other long term (current) drug therapy: Secondary | ICD-10-CM | POA: Insufficient documentation

## 2018-03-18 DIAGNOSIS — R739 Hyperglycemia, unspecified: Secondary | ICD-10-CM | POA: Insufficient documentation

## 2018-03-18 MED ORDER — AMLODIPINE BESYLATE 5 MG PO TABS: 5.0000 mg | ORAL_TABLET | Freq: Every day | ORAL | 1 refills | Status: DC

## 2018-03-18 NOTE — Progress Notes (Signed)
Assessment & Plan:  Sandra Gardner was seen today for establish care.  Diagnoses and all orders for this visit:  Essential hypertension -     Lipid panel -     CMP14+EGFR -     amLODipine (NORVASC) 5 MG tablet; Take 1 tablet (5 mg total) by mouth daily. Continue all antihypertensives as prescribed.  Remember to bring in your blood pressure log with you for your follow up appointment.  DASH/Mediterranean Diets are healthier choices for HTN.   Elevated glucose level -     Hemoglobin A1c  Rosacea -     Ambulatory referral to Dermatology    Patient has been counseled on age-appropriate routine health concerns for screening and prevention. These are reviewed and up-to-date. Referrals have been placed accordingly. Immunizations are up-to-date or declined.   SHE WAS REFERRED TO [facility] TODAY FOR SCREENING MAMMOGRAM Subjective:   Chief Complaint  Patient presents with  . Establish Care    Pt. is here to establish care for hypertension.    HPI Sandra Gardner 62 y.o. female presents to office today to establish care for HTN. VRI was used to communicate directly with patient for the entire encounter including providing detailed patient instructions. She has a history    CHRONIC HYPERTENSION Disease Monitoring  Blood pressure range: Blood pressure is well controlled today.  BP Readings from Last 3 Encounters:  03/18/18 137/85  07/18/17 (!) 147/74  07/13/17 (!) 138/101   Chest pain: no   Dyspnea: no   Claudication: no  Medication compliance: yes  Medication Side Effects  Lightheadedness: no   Urinary frequency: no   Edema: no   Impotence: no  Preventitive Healthcare:  Exercise: yes   Diet Pattern: diet: general  Salt Restriction:  no   Rosacea Patient complains of increasing redness on face. Prior symptoms: redness, pustules, dryness.   Therapy thus far has included OTC acne cream with no relief and  [place] gel BID prescribed for her after an ED visit on 07-11-2017.     Review of Systems  Constitutional: Negative for fever, malaise/fatigue and weight loss.  HENT: Negative.  Negative for nosebleeds.   Eyes: Negative.  Negative for blurred vision, double vision and photophobia.  Respiratory: Negative.  Negative for cough and shortness of breath.   Cardiovascular: Negative.  Negative for chest pain, palpitations and leg swelling.  Gastrointestinal: Negative.  Negative for heartburn, nausea and vomiting.  Musculoskeletal: Negative.  Negative for myalgias.  Neurological: Negative.  Negative for dizziness, focal weakness, seizures and headaches.  Psychiatric/Behavioral: Negative.  Negative for suicidal ideas.    Past Medical History:  Diagnosis Date  . Cystocele   . Hypertension   . Urinary incontinence   . Vaginal atrophy     Past Surgical History:  Procedure Laterality Date  . CESAREAN SECTION     3 previous  . CHOLECYSTECTOMY    . COLONOSCOPY N/A 10/18/2016   Procedure: COLONOSCOPY;  Surgeon: Danie Binder, MD;  Location: [facility];  Service: Endoscopy;  Laterality: N/A;  1230   . ENDOMETRIAL ABLATION  2012  . SHOULDER SURGERY Right   . TUBAL LIGATION     BTL-  07-270    Family History  Problem Relation Age of Onset  . Diabetes Father   . Hypertension Paternal Grandmother   . Thyroid disease Sister     Social History Reviewed with no changes to be made today.   Outpatient Medications Prior to Visit  Medication Sig Dispense Refill  .  Naproxen Sodium (ALEVE PO) Take by mouth.    Marland Kitchen amLODipine (NORVASC) 5 MG tablet Take 1 tablet (5 mg total) by mouth daily. 30 tablet 2  . atorvastatin (LIPITOR) 10 MG tablet Take 1 tablet (10 mg total) by mouth daily. (Patient not taking: Reported on 03/18/2018) 30 tablet 2  . vitamin E 400 UNIT capsule Take 400 Units by mouth daily.    . fluticasone (FLONASE) 50 MCG/ACT nasal spray Place 1 spray into both nostrils daily. (Patient not taking: Reported on 07/18/2017) 16 g 2  . hydrOXYzine  (ATARAX/VISTARIL) 25 MG tablet Take 1 tablet (25 mg total) by mouth every 8 (eight) hours as needed for itching. (Patient not taking: Reported on 03/18/2018) 20 tablet 0  . metroNIDAZOLE (METROGEL) 0.75 % gel Apply 1 application topically 2 (two) times daily. (Patient not taking: Reported on 03/18/2018) 45 g 0  . naproxen sodium (ANAPROX) 220 MG tablet Take 220 mg by mouth as needed (pain).    . predniSONE (STERAPRED UNI-PAK 21 TAB) 10 MG (21) TBPK tablet Starting tomorrow 07/14/17 take 6 tablets day one then 5, 4, 3, 2, 1 (Patient not taking: Reported on 03/18/2018) 21 tablet 0   No facility-administered medications prior to visit.     Allergies  Allergen Reactions  . Apple Swelling  . Cherry     swelling  . Citrus Aurantium [Citrus]        Objective:    BP 137/85 (BP Location: Left Arm, Patient Position: Sitting, Cuff Size: Normal)   Pulse 73   Temp 99.1 F (37.3 C) (Oral)   Ht _0  (1.448 m)   Wt 123 lb 3.2 oz (55.9 kg)   SpO2 96%   BMI 26.66 kg/m  Wt Readings from Last 3 Encounters:  03/18/18 123 lb 3.2 oz (55.9 kg)  07/18/17 124 lb 6.4 oz (56.4 kg)  07/11/17 145 lb (65.8 kg)    Physical Exam  Constitutional: She is oriented to person, place, and time. She appears well-developed and well-nourished. She is cooperative.  HENT:  Head: Normocephalic and atraumatic.    Eyes: EOM are normal.  Neck: Normal range of motion.  Cardiovascular: Normal rate, regular rhythm and intact distal pulses. Exam reveals no gallop and no friction rub.  Murmur heard. Pulmonary/Chest: Effort normal and breath sounds normal. No tachypnea. No respiratory distress. She has no decreased breath sounds. She has no wheezes. She has no rhonchi. She has no rales. She exhibits no tenderness.  Abdominal: Soft. Bowel sounds are normal.  Musculoskeletal: Normal range of motion. She exhibits no edema.  Neurological: She is alert and oriented to person, place, and time. Coordination normal.  Skin: Skin is  warm and dry. Rash noted. Rash is macular. Rash is not pustular.  Psychiatric: She has a normal mood and affect. Her behavior is normal. Judgment and thought content normal.  Nursing note and vitals reviewed.      Patient has been counseled extensively about nutrition and exercise as well as the importance of adherence with medications and regular follow-up. The patient was given clear instructions to go to ER or return to medical center if symptoms don't improve, worsen or new problems develop. The patient verbalized understanding.   Follow-up: Return for schedule for pap smear; she needs a wednesday appt.   Gildardo Pounds, FNP-BC [facility] and [place], [place]   03/18/2018, 2:17 PM

## 2018-03-18 NOTE — Patient Instructions (Signed)
A preveno do diabetes mellitus tipo 2 Preventing Type 2 Diabetes Mellitus O diabete tipo 2 (diabetes mellitus tipo 2)  uma doena de longo prazo (crnica) que afeta os nveis de acar no sangue (glicose). Normalmente, um hormnio chamado insulina permite que a glicose penetre nas clulas do organismo. As clulas usam glicose para obter energia. Na diabetes tipo 2, um desses problemas ou ambos podem estar presentes:  O organismo no produz insulina suficiente.  O organismo no responde de [place] apropriada  insulina produzida por ele (resistncia  insulina).  A resistncia  insulina ou a falta de insulina faz com que excesso de glicose se acumule no sangue em vez de ir para as clulas. Como resultado, ocorre elevada glicose sangunea (hiperglicemia), o que pode causar muitas complicaes. Ter sobrepeso ou ser obeso e ter um estilo de vida inativo (sedentrio) pode aumentar seu risco de diabetes. O diabetes tipo 2 pode se retardado ou prevenido com certas mudanas nutricionais e de estilo de vida. Que mudanas nutricionais podem ser feitas?  Comer refeies e lanches saudveis regularmente. Levar consigo um lanche saudvel para quando sentir fome entre refeies, como uma fruta ou um punhado de nozes.  Comer carnes e protenas magras com baixo teor de gorduras saturadas, como frango, peixe, clara de ovo e feijes. Evitar carnes processadas.  Comer muitas frutas e verduras e muitos gros no processados (gros integrais). Recomenda-se que voc coma: ? 1?2 xcaras de frutas todos os dias. ? 2?3 xcaras de verduras todos os dias. ? 6?8 onas de gros integrais todos os dias, tais como [place], trigo integral, triguilho, arroz integral, quinoa e paino.  Escolha laticnios desnatados ou pobres em gordura, como leite, iogurte e queijo.  Alimentos contendo gorduras saudveis, como nozes, abacate, azeite de [place] e leo de canola.  Beba gua durante o dia inteiro. Evite bebidas com acar  adicionado, como refrigerante ou ch adoado.  Siga as instrues do seu mdico no que se refere a restries especficas de alimentos ou bebidas.  Controle a comida consumida em cada refeio (tamanho da poro). ? Verifique os rtulos nutricionais para descobrir os [employer] pores. ? Use uma balana de cozinha para pesar os alimentos.  Prepare os alimentos por salteamento ou a vapor em vez de frit-los. Cozinhe com gua ou caldo em vez de leo ou [place].  Limite a ingesto de: ? Sal (sdio). No consuma mais do que 1 colher de ch (2.400 mg) de sdio por dia. Caso sofra de doena cardaca ou presso alta, consuma menos de  a  de colher de ch (1.500 mg) de sdio por dia. ? Gordura saturada. Essa  a gordura que permanece slida a temperatura ambiente, como [place] ou a gordura de carnes. Que mudanas de estilo de vida podem ser feitas?  Atividades  Realize atividade fsica de intensidade moderada por pelo menos 30 minutos em pelo menos 5 dias da semana ou pelo tempo determinado pelo seu mdico.  Pergunte ao seu mdico quais atividades so seguras para voc. Um misto de atividades fsicas pode ser melhor, como caminhar, nadar, andar de bicicleta e fazer exerccios de fora.  Tente adicionar atividades fsicas no seu dia a dia. Por exemplo: ? Estacione em [employer] do que o normal para Publishing rights manager. Por exemplo: estacione em um canto distante do estacionamento quando for para o trabalho ou para o supermercado. ? Faa uma caminhada durante a pausa do almoo. ? Utilize escadas em vez de elevadores. Perda de [employer] de acordo com as  recomendaes. Seu mdico poder determinar Cassell Smilesquanto peso  melhor voc perder e ajudar voc a perder peso com segurana.  Caso esteja com sobrepeso ou seja obeso, voc poder ser instrudo a perder de 5 a 7% do seu peso corporal. lcool e tabaco   Limite o consumo de lcool a no mximo 1 dose por dia para mulheres no grvidas e  2 doses por dia para homens. Uma dose equivale a 12 onas de cerveja, 5 onas de vinho ou 1 ona de bebida destilada.  No use nenhum produto de tabaco, como cigarros, tabaco de Theatre managermascar e cigarros eletrnicos. Caso precise de ajuda para parar de fumar, fale com seu mdico. Colaborao com seu mdico  Verifique sua glicose sangunea regularmente com a frequncia indicada pelo seu mdico.  Discuta seus fatores de risco e como voc pode reduzir seu risco de diabete.  Faa exames preventivos de acordo com as indicaes do seu mdico. Voc poder fazer exames preventivos regularmente, especialmente se apresentar certos fatores de risco para diabete tipo 2.  Marque uma consulta com um especialista em dieta e nutrio (nutricionista certificado). Um nutricionista certificado poder ajudar voc a elaborar um plano de refeies saudvel e ajudar voc a entender os tamanhos de pores e rtulos nutricionais. Por que essas mudanas so importantes?   possvel prevenir ou retardar o diabetes tipo 2 e problemas de sade relacionados fazendo mudanas de estilo de vida e nutricionais.  Pode ser difcil reconhecer os sintomas do diabetes tipo 2. A melhor maneira de evitar possveis danos ao seu organismo  tomar medidas para prevenir a doena antes de voc apresentar sintomas. O que pode acontecer se eu no fizer mudanas?  Seus nveis de glicose sangunea podero continuar a aumentar. Apresentar elevados nveis de glicose sangunea por muito tempo  perigoso. Glicose demais no sangue pode danificar seus vasos sanguneos, corao, rins, nervos e olhos.  Voc poder apresentar pr-diabetes ou diabetes tipo 2. O diabetes tipo 2 pode levar a muitos problemas de sade crnicos e complicaes, tais como: ? Doena cardaca. ? Acidente vascular cerebral (AVC). ? Cegueira. ? Doena renal. ? Depresso. ? Circulao ruim nos ps e nas pernas, o que pode levar  remoo cirrgica (amputao) em casos severos. Onde  encontrar apoio:  Pea ao seu mdico para recomendar um nutricionista certificado, um especialista em diabetes ou um programa de perda de [place].  Procure grupos de perda de peso locais ou on-line.  Associe-se a Electronic Data Systemsuma academia ou grupo de atividades ao ar livre, como um clube de caminhadas. Onde conseguir mais informaes: Para aprender IKON Office Solutionsmais sobre o diabetes e a preveno do diabetes, visite:  Associao de Diabetes Americana (American Diabetes Association, ADA): www.diabetes.AK Steel Holding Corporationorg  National Institute of Diabetes and Digestive and Kidney Diseases [facility](Instituto Nacional de Diabetes e Doenas Digestivas e Renais): ToyArticles.cawww.niddk.nih.gov/health-information/diabetes  Para aprender Bennett Scrapemais sobre a nutrio, visite:  The U.S. Department of Agriculture (Departamento [place] de Estate manager/land agentAgricultura, Medical sales representativeUSDA), Choose My Plate: http://yates.biz/www.choosemyplate.gov/food-groups  Office of Disease Prevention and Health Promotion (Escritrio de Preveno de Doenas e Promoo da [place], [place]), diretrizes nutricionais: ListingMagazine.siwww.health.gov/dietaryguidelines  Resumo  Voc pode reduzir o risco de diabetes tipo 2 aumentando a atividade fsica, comendo alimentos saudveis e perdendo peso de acordo com as instrues.  Converse com seu mdico sobre o seu risco de diabetes tipo 2. Pergunte sobre quaisquer exames de sangue ou preventivos de que voc precise. Estas informaes no se destinam a substituir as recomendaes de seu mdico. No deixe de discutir quaisquer dvidas com seu mdico. Document Released: 10/29/2016 Document Revised: 10/29/2016  Elsevier Interactive Patient Education  2018 Elsevier Inc.  

## 2018-03-19 ENCOUNTER — Other Ambulatory Visit: Payer: Self-pay | Admitting: Nurse Practitioner

## 2018-03-19 LAB — CMP14+EGFR
A/G RATIO: 1.6 (ref 1.2–2.2)
ALBUMIN: 4.7 g/dL (ref 3.6–4.8)
ALK PHOS: 90 IU/L (ref 39–117)
ALT: 19 IU/L (ref 0–32)
AST: 19 IU/L (ref 0–40)
BILIRUBIN TOTAL: 0.2 mg/dL (ref 0.0–1.2)
BUN / CREAT RATIO: 26 (ref 12–28)
BUN: 14 mg/dL (ref 8–27)
CO2: 21 mmol/L (ref 20–29)
Calcium: 10.2 mg/dL (ref 8.7–10.3)
Chloride: 99 mmol/L (ref 96–106)
Creatinine, Ser: 0.54 mg/dL — ABNORMAL LOW (ref 0.57–1.00)
GFR calc Af Amer: 118 mL/min/{1.73_m2} (ref >59)
GFR calc non Af Amer: 102 mL/min/{1.73_m2} (ref >59)
GLOBULIN, TOTAL: 3 g/dL (ref 1.5–4.5)
Glucose: 140 mg/dL — ABNORMAL HIGH (ref 65–99)
POTASSIUM: 4.2 mmol/L (ref 3.5–5.2)
SODIUM: 140 mmol/L (ref 134–144)
Total Protein: 7.7 g/dL (ref 6.0–8.5)

## 2018-03-19 LAB — HEMOGLOBIN A1C
ESTIMATED AVERAGE GLUCOSE: 137 mg/dL
Hgb A1c MFr Bld: 6.4 % — ABNORMAL HIGH (ref 4.8–5.6)

## 2018-03-19 LAB — LIPID PANEL
CHOLESTEROL TOTAL: 193 mg/dL (ref 100–199)
Chol/HDL Ratio: 3.6 ratio (ref 0.0–4.4)
HDL: 54 mg/dL (ref >39)
LDL CALC: 107 mg/dL — AB (ref 0–99)
TRIGLYCERIDES: 160 mg/dL — AB (ref 0–149)
VLDL CHOLESTEROL CAL: 32 mg/dL (ref 5–40)

## 2018-03-19 MED ORDER — ATORVASTATIN CALCIUM 10 MG PO TABS: 10.0000 mg | ORAL_TABLET | Freq: Every day | ORAL | 2 refills | Status: DC

## 2018-03-20 ENCOUNTER — Telehealth: Payer: Self-pay

## 2018-03-20 NOTE — Telephone Encounter (Signed)
CMA attempt to call patient to inform on lab results.  No answer and left a VM for patient to call back.  If patient call back, please inform:  Cholesterol levels are still elevated. I have sent cholesterol medication to the pharmacy for you to take. You should continue to take this medication until otherwise instructed. Also your A1c shows prediabetes.  We will recheck in 6 months. In the meantime INSTRUCTIONS: Work on a low fat, heart healthy diet and participate in regular aerobic exercise program by working out at least 150 minutes per week; 5 days a week-30 minutes per day. Avoid red meat, fried foods. junk foods, breads, rice, potatoes, tortillas, sodas, sugary drinks, unhealthy snacking, alcohol and smoking.  Drink at least 48oz of water per day and monitor your carbohydrate intake daily.  A letter will be send out to patient.

## 2018-03-20 NOTE — Telephone Encounter (Signed)
-----   Message from Claiborne RiggZelda W Fleming, NP sent at 03/19/2018 11:30 PM EDT ----- Cholesterol levels are still elevated. I have sent cholesterol medication to the pharmacy for you to take. You should continue to take this medication until otherwise instructed. Also your A1c shows prediabetes.  We will recheck in 6 months. In the meantime INSTRUCTIONS: Work on a low fat, heart healthy diet and participate in regular aerobic exercise program by working out at least 150 minutes per week; 5 days a week-30 minutes per day. Avoid red meat, fried foods. junk foods, breads, rice, potatoes, tortillas, sodas, sugary drinks, unhealthy snacking, alcohol and smoking.  Drink at least 48oz of water per day and monitor your carbohydrate intake daily.

## 2018-03-25 ENCOUNTER — Encounter: Payer: Self-pay | Admitting: Nurse Practitioner

## 2018-04-09 ENCOUNTER — Ambulatory Visit: Payer: Self-pay | Attending: Nurse Practitioner | Admitting: Nurse Practitioner

## 2018-04-09 ENCOUNTER — Encounter: Payer: Self-pay | Admitting: Nurse Practitioner

## 2018-04-09 VITALS — BP 148/80 | HR 64 | Temp 98.8°F | Ht <= 58 in | Wt 121.2 lb

## 2018-04-09 DIAGNOSIS — Z1272 Encounter for screening for malignant neoplasm of vagina: Secondary | ICD-10-CM | POA: Insufficient documentation

## 2018-04-09 DIAGNOSIS — Z01419 Encounter for gynecological examination (general) (routine) without abnormal findings: Secondary | ICD-10-CM | POA: Insufficient documentation

## 2018-04-09 DIAGNOSIS — Z124 Encounter for screening for malignant neoplasm of cervix: Secondary | ICD-10-CM

## 2018-04-09 DIAGNOSIS — I10 Essential (primary) hypertension: Secondary | ICD-10-CM | POA: Insufficient documentation

## 2018-04-09 NOTE — Patient Instructions (Addendum)
Vaginitis atrfica (Atrophic Vaginitis) La vaginitis atrfica es una afeccin en la que los tejidos que recubren la vagina se secan y afinan. Esta afeccin es ms frecuente en las mujeres que ya no tienen perodos menstruales regulares (menopausia). Esto suele comenzar cuando la mujer tiene entre 45 y 55aos. El estrgeno ayuda a mantener la humedad de la vagina, ya que la estimula para producir un lquido transparente que lubrica la vagina durante las relaciones sexuales. Este lquido tambin protege a la vagina contra las infecciones. La falta de estrgeno puede hacer que el recubrimiento de la vagina se afine y se seque. El tamao de la vagina tambin puede disminuir y perder elasticidad. La vaginitis atrfica tiende a empeorar con el tiempo a medida que el nivel de estrgeno desciende. CAUSAS La causa de esta afeccin es el descenso normal del nivel de estrgeno que se produce en torno a la menopausia. FACTORES DE RIESGO Determinadas afecciones o situaciones pueden reducir el nivel de estrgeno, lo que aumenta el riesgo de que una mujer presente vaginitis atrfica. Estos incluyen los siguientes:  Tomar un medicamento que detenga la produccin de estrgeno.  Someterse a una ciruga de extraccin de ovarios.  Recibir tratamiento contra el cncer mediante rayos X (radiacin) o medicamentos (quimioterapia).  Hacer ejercicios muy intensos y con mucha frecuencia.  Tener un trastorno alimenticio (anorexia).  Dar a luz o amamantar.  Tener ms de 50 aos.  Fumar. SNTOMAS Los sntomas de esta afeccin incluyen lo siguiente:  Dolor, irritacin o sangrado durante las relaciones sexuales (dispareunia).  Ardor, irritacin o picazn vaginal.  Dolor o sangrado durante un examen vaginal con espculo (examen plvico).  Falta de inters en las relaciones sexuales.  Ardor al orinar.  Flujo vaginal marrn o amarillo. En algunos casos no hay sntomas. DIAGNSTICO Esta afeccin se diagnostica  mediante la historia clnica y un examen fsico. Este incluye un examen plvico que verifica si los tejidos de la parte interna de la vagina se ven plidos, delgados o secos. En contadas ocasiones, tambin pueden hacerle otros estudios, como los siguientes:  Anlisis de orina.  Un estudio que verifica el equilibrio de cido en el lquido vaginal (estudio de equilibrio de cido). TRATAMIENTO El tratamiento de esta afeccin puede depender de la gravedad de los sntomas. El tratamiento puede incluir lo siguiente:  Uso de un lubricante vaginal de venta libre antes de las relaciones sexuales.  Uso de una crema humectante vaginal de accin prolongada.  Uso de estrgeno vaginal de dosis bajas para los sntomas de moderados a graves que no responden a otros tratamientos. Las opciones incluyen cremas, tabletas y anillos vaginales. Antes de usar estrgeno vaginal, dgale al mdico si tiene antecedentes de: ? Cncer de mama. ? Cncer de endometrio. ? Cogulos sanguneos.  Tomar medicamentos. Probablemente pueda tomar un comprimido diario para la dispareunia. Analice todos los riesgos de este medicamento con el mdico. Por lo general, no se recomienda en mujeres con antecedentes personales o familiares de cncer de mama. Si sus sntomas son muy leves y usted no mantiene relaciones sexuales, es posible que no necesite tratamiento. INSTRUCCIONES PARA EL CUIDADO EN EL HOGAR  Tome los medicamentos solamente como se lo haya indicado el mdico. No utilice medicamentos alternativos o a base de hierbas a menos que el mdico se lo autorice.  Utilice cremas, lubricantes o cremas humectantes de venta libre para la sequedad vaginal solamente como se lo haya indicado el mdico.  Si la causa de la vaginitis atrfica es la menopausia, analice todos sus   sntomas y opciones de tratamiento con el mdico.  No se haga duchas vaginales.  No use productos que puedan causarle sequedad vaginal. Estos incluyen los  siguientes: ? Aerosoles femeninos perfumados. ? Tampones perfumados. ? Jabones perfumados.  Hable con su pareja sexual si el coito le resulta doloroso. SOLICITE ATENCIN MDICA SI:  El flujo vaginal tiene un aspecto diferente del normal.  Detecta que tiene un olor inusual en la vagina.  Aparecen nuevos sntomas.  Los sntomas no mejoran con el tratamiento.  Los sntomas empeoran. Esta informacin no tiene como fin reemplazar el consejo del mdico. Asegrese de hacerle al mdico cualquier pregunta que tenga. Document Released: 11/23/2014 Document Revised: 11/23/2014 Document Reviewed: 06/30/2014 Elsevier Interactive Patient Education  2018 Elsevier Inc.  

## 2018-04-09 NOTE — Progress Notes (Signed)
Assessment & Plan:  Sandra Gardner was seen today for gynecologic exam.  Diagnoses and all orders for this visit:  Encounter for Papanicolaou smear for cervical cancer screening -     Cytology - PAP    Patient has been counseled on age-appropriate routine health concerns for screening and prevention. These are reviewed and up-to-date. Referrals have been placed accordingly. Immunizations are up-to-date or declined.    Subjective:   Chief Complaint  Patient presents with  . Gynecologic Exam    Pt. is here for a pap smear.    HPI Sandra Gardner 62 y.o. female presents to office today for PAP. She also has complaints of post menopausal vaginal atrophy. I have recommended that she use a small amount of 100% virgin olive oil around the majora labia for comfort.   Review of Systems  Constitutional: Negative.  Negative for chills, fever, malaise/fatigue and weight loss.  Respiratory: Negative.  Negative for cough, shortness of breath and wheezing.   Cardiovascular: Negative.  Negative for chest pain, orthopnea and leg swelling.  Gastrointestinal: Negative for abdominal pain.  Genitourinary: Negative for flank pain.       SEE HPI  Skin: Negative.  Negative for rash.  Psychiatric/Behavioral: Negative for suicidal ideas.    Past Medical History:  Diagnosis Date  . Cystocele   . Hypertension   . Urinary incontinence   . Vaginal atrophy     Past Surgical History:  Procedure Laterality Date  . CESAREAN SECTION     3 previous  . CHOLECYSTECTOMY    . COLONOSCOPY N/A 10/18/2016   Procedure: COLONOSCOPY;  Surgeon: West Bali, MD;  [facility];  Service: Endoscopy;  Laterality: N/A;  1230   . ENDOMETRIAL ABLATION  2012  . SHOULDER SURGERY Right   . TUBAL LIGATION     BTL-  [id number]    Family History  Problem Relation Age of Onset  . Diabetes Father   . Hypertension Paternal Grandmother   . Thyroid disease Sister     Social History Reviewed with no changes to be made  today.   Outpatient Medications Prior to Visit  Medication Sig Dispense Refill  . amLODipine (NORVASC) 5 MG tablet Take 1 tablet (5 mg total) by mouth daily. 90 tablet 1  . atorvastatin (LIPITOR) 10 MG tablet Take 1 tablet (10 mg total) by mouth daily. 30 tablet 2  . Naproxen Sodium (ALEVE PO) Take by mouth.    . vitamin E 400 UNIT capsule Take 400 Units by mouth daily.     No facility-administered medications prior to visit.     Allergies  Allergen Reactions  . Apple Swelling  . Cherry     swelling  . Citrus Aurantium [Citrus]        Objective:    BP (!) 148/80 (BP Location: Right Arm, Patient Position: Sitting, Cuff Size: Normal)   Pulse 64   Temp 98.8 F (37.1 C) (Oral)   Ht 4\' 9"  (1.448 m)   Wt 121 lb 3.2 oz (55 kg)   SpO2 100%   BMI 26.23 kg/m  Wt Readings from Last 3 Encounters:  04/09/18 121 lb 3.2 oz (55 kg)  03/18/18 123 lb 3.2 oz (55.9 kg)  07/18/17 124 lb 6.4 oz (56.4 kg)    Physical Exam  Constitutional: She is oriented to person, place, and time. She appears well-developed and well-nourished.  HENT:  Head: Normocephalic.  Cardiovascular: Normal rate, regular rhythm and normal heart sounds.  Pulmonary/Chest: Effort normal  and breath sounds normal.  Abdominal: Soft. Bowel sounds are normal. Hernia confirmed negative in the right inguinal area and confirmed negative in the left inguinal area.  Genitourinary: Rectum normal, vagina normal and uterus normal. Rectal exam shows no external hemorrhoid. No labial fusion. There is no rash, tenderness, lesion or injury on the right labia. There is no rash, tenderness, lesion or injury on the left labia. Uterus is not deviated and not enlarged. Cervix exhibits no motion tenderness and no friability. Right adnexum displays no mass, no tenderness and no fullness. Left adnexum displays no mass, no tenderness and no fullness. No erythema, tenderness or bleeding in the vagina. No foreign body in the vagina. No signs of injury  around the vagina. No vaginal discharge found.  Lymphadenopathy: No inguinal adenopathy noted on the right or left side.       Right: No inguinal adenopathy present.       Left: No inguinal adenopathy present.  Neurological: She is alert and oriented to person, place, and time.  Skin: Skin is warm and dry.  Psychiatric: She has a normal mood and affect. Her behavior is normal. Judgment and thought content normal.         Patient has been counseled extensively about nutrition and exercise as well as the importance of adherence with medications and regular follow-up. The patient was given clear instructions to go to ER or return to medical center if symptoms don't improve, worsen or new problems develop. The patient verbalized understanding.   Follow-up: Return in about 4 months (around 08/09/2018) for HTN.   Claiborne RiggZelda W Fleming, FNP-BC [facility] [address] [place], [place] [phone]   04/09/2018, 4:06 PM

## 2018-04-10 LAB — CYTOLOGY - PAP
Bacterial vaginitis: POSITIVE — AB
CANDIDA VAGINITIS: NEGATIVE
CHLAMYDIA, DNA PROBE: NEGATIVE
Diagnosis: NEGATIVE
Neisseria Gonorrhea: NEGATIVE
Trichomonas: NEGATIVE

## 2018-04-11 ENCOUNTER — Other Ambulatory Visit: Payer: Self-pay | Admitting: Nurse Practitioner

## 2018-04-11 ENCOUNTER — Telehealth: Payer: Self-pay

## 2018-04-11 MED ORDER — METRONIDAZOLE 500 MG PO TABS: 500.0000 mg | ORAL_TABLET | Freq: Two times a day (BID) | ORAL | 0 refills | Status: AC

## 2018-04-11 NOTE — Telephone Encounter (Signed)
-----   Message from Claiborne RiggZelda W Fleming, NP sent at 04/11/2018 12:05 AM EDT ----- PAP smear showing bacterial vaginosis. Will send prescription. Next PAP in 3 years.

## 2018-04-11 NOTE — Telephone Encounter (Signed)
CMA spoke to patient to inform on results.  Patient verified DOB. Patient understood.  Spanish [place] interpreter Lorra HalsSergie [phone] assist with the call.

## 2018-05-15 ENCOUNTER — Encounter: Payer: Self-pay | Admitting: Family Medicine

## 2018-05-15 ENCOUNTER — Other Ambulatory Visit: Payer: Self-pay

## 2018-05-15 ENCOUNTER — Ambulatory Visit (INDEPENDENT_AMBULATORY_CARE_PROVIDER_SITE_OTHER): Payer: Self-pay | Admitting: Family Medicine

## 2018-05-15 VITALS — BP 134/70 | HR 67 | Temp 98.4°F | Ht <= 58 in | Wt 116.0 lb

## 2018-05-15 DIAGNOSIS — R21 Rash and other nonspecific skin eruption: Secondary | ICD-10-CM

## 2018-05-15 MED ORDER — METRONIDAZOLE 500 MG PO TABS: 500.0000 mg | ORAL_TABLET | Freq: Every day | ORAL | 0 refills | Status: AC

## 2018-05-15 NOTE — Patient Instructions (Signed)
Erupcin cutnea (Rash) Una erupcin cutnea es un cambio en el color de la piel. Una erupcin tambin puede cambiar la forma en que se siente la piel. Hay muchas afecciones y factores diferentes que pueden causar una erupcin. CUIDADOS EN EL HOGAR Est atento a cualquier cambio en los sntomas. Estas indicaciones pueden ayudarlo con el trastorno: Medicamentos T[place] o aplique los medicamentos de venta libre y los recetados solamente como se lo haya indicado el mdico. Estos pueden incluir lo siguiente:  Crema con corticoides.  Lociones para aliviar la picazn.  Antihistamnicos por va oral. Cuidado de la piel  Coloque compresas fras en las zonas afectadas.  Trate de tomar un bao con lo siguiente: ? Sales de [place]. Siga las instrucciones del envase. Puede conseguirlas en la tienda de comestibles o la farmacia local. ? Bicarbonato de sodio. Vierta un poco en la baera como se lo haya indicado el mdico. ? Avena coloidal. Siga las instrucciones del envase. Puede conseguirla en la tienda de comestibles o la farmacia local.  Intente colocarse una pasta de bicarbonato de sodio sobre la piel. Agregue agua al bicarbonato de sodio hasta que se forme una pasta.  No se rasque ni se refriegue la piel.  Evite cubrir la erupcin. Asegrese de que la erupcin est expuesta al aire todo lo posible. Instrucciones generales  Evite los baos de inmersin y las duchas calientes ya que pueden empeorar la picazn. Una ducha fra puede aliviar el malestar.  Evite los detergentes y los jabones perfumados, y los perfumes. Utilice jabones, detergentes, perfumes y cosmticos suaves.  Evite todo lo que le cause erupcin cutnea. Lleve un diario como ayuda para registrar lo que le causa erupcin. Escriba los siguientes datos: ? Lo que come. ? Los cosmticos que utiliza. ? Lo que bebe. ? La ropa que usa. Esto incluye las alhajas.  Concurra a todas las visitas de control como se lo haya indicado el mdico.  Esto es importante. SOLICITE AYUDA SI:  Transpira de noche.  Pierde peso.  Orina ms de lo normal.  Se siente dbil.  Vomita.  Tiene un color amarillo en la piel o en la zona blanca del ojo (ictericia).  La piel: ? Siente hormigueos. ? Se adormece.  La erupcin cutnea: ? No desaparece despus de unos das. ? Empeora.  Usted: ? Est ms sediento que lo habitual. ? Est ms cansado que lo habitual.  Tiene los siguientes sntomas: ? Sntomas nuevos. ? Dolor en el vientre (abdomen). ? Fiebre. ? Deposiciones lquidas (diarrea). SOLICITE AYUDA DE INMEDIATO SI:  La erupcin cubre todo el cuerpo o la mayor parte de este. La erupcin puede ser dolorosa o no.  Tiene ampollas con las siguientes caractersticas: ? Se ubican sobre la erupcin. ? Se agrandan. ? Crecen juntas. ? Son dolorosas. ? Estn dentro de la nariz o la boca.  Tiene una erupcin cutnea con estas caractersticas: ? Tiene pequeas manchas moradas, como si fueran pinchazos, en todo el cuerpo. ? Tiene un aspecto parecido a una diana o a un blanco de tiro. ? Est enrojecida y le duele, le produce descamacin de la piel y no se relaciona con haber estado mucho tiempo bajo el sol. Esta informacin no tiene como fin reemplazar el consejo del mdico. Asegrese de hacerle al mdico cualquier pregunta que tenga. Document Released: 10/05/2008 Document Revised: 10/31/2015 Document Reviewed: 11/24/2014 Elsevier Interactive Patient Education  2018 Elsevier Inc.  

## 2018-05-15 NOTE — Progress Notes (Signed)
  Subjective:     Patient ID: Sandra Gardner, female   DOB: 06/06/1956, 62 y.o.   MRN: [id number]  Rash  This is a chronic problem. The current episode started more than 1 year ago (more than 6 years ago). The problem has been waxing and waning since onset. Location: face  The rash is characterized by itchiness, redness and dryness. Associated symptoms include joint pain. Pertinent negatives include no anorexia, eye pain or fever. (Hx of B/L knee pain in the past. She had left knee surgery) Treatments tried: Took some meds many years ago. She can't remember the name. Improvement on treatment: Topical treatment made her symptoms worse and she was switched to oral medication in the past.   Current Outpatient Medications on File Prior to Visit  Medication Sig Dispense Refill  . amLODipine (NORVASC) 5 MG tablet Take 1 tablet (5 mg total) by mouth daily. 90 tablet 1  . atorvastatin (LIPITOR) 10 MG tablet Take 1 tablet (10 mg total) by mouth daily. 30 tablet 2  . Naproxen Sodium (ALEVE PO) Take by mouth.    . vitamin E 400 UNIT capsule Take 400 Units by mouth daily.     No current facility-administered medications on file prior to visit.    Past Medical History:  Diagnosis Date  . Cystocele   . Hypertension   . Urinary incontinence   . Vaginal atrophy    Vitals:   05/15/18 1536  BP: 134/70  Pulse: 67  Temp: 98.4 F (36.9 C)  TempSrc: Oral  SpO2: 99%  Weight: 116 lb (52.6 kg)  Height: 4\' 9"  (1.448 m)    Review of Systems  Constitutional: Negative for fever.  Eyes: Negative for pain.  Respiratory: Negative.   Cardiovascular: Negative.   Gastrointestinal: Negative for anorexia.  Musculoskeletal: Positive for joint pain.  Skin: Positive for rash.  All other systems reviewed and are negative.      Objective:   Physical Exam  Constitutional: She appears well-developed and well-nourished. No distress.  Pulmonary/Chest: Effort normal.  Skin: Rash noted. Rash is macular.  Mildly  erythematous macular rash on her cheeks. No nasal lesion.  Nursing note and vitals reviewed.        Assessment:     Facial rash. Looks like malar rash.. Differentials include SLE or Rosacea    Plan:     We will treat for rosacea while evaluating for lupus. ANA and dsDNA checked today. I will contact her with the result. Oral Metronidazole given. F/U in 2-3 weeks for reassessment.

## 2018-05-16 LAB — ANTI-DNA ANTIBODY, DOUBLE-STRANDED: dsDNA Ab: 1 IU/mL (ref 0–9)

## 2018-05-16 LAB — ANA: ANA: NEGATIVE

## 2018-05-29 ENCOUNTER — Ambulatory Visit
Admission: RE | Admit: 2018-05-29 | Discharge: 2018-05-29 | Disposition: A | Discharge status: Home / Self Care | Payer: Self-pay | Source: Ambulatory Visit | Attending: Obstetrics and Gynecology | Admitting: Obstetrics and Gynecology

## 2018-05-29 ENCOUNTER — Ambulatory Visit (HOSPITAL_COMMUNITY)
Admission: RE | Admit: 2018-05-29 | Discharge: 2018-05-29 | Disposition: A | Discharge status: Home / Self Care | Payer: Self-pay | Source: Ambulatory Visit | Attending: Obstetrics and Gynecology | Admitting: Obstetrics and Gynecology

## 2018-05-29 ENCOUNTER — Encounter (HOSPITAL_COMMUNITY): Payer: Self-pay

## 2018-05-29 VITALS — BP 118/78 | Wt 115.0 lb

## 2018-05-29 DIAGNOSIS — Z1231 Encounter for screening mammogram for malignant neoplasm of breast: Secondary | ICD-10-CM

## 2018-05-29 DIAGNOSIS — Z1239 Encounter for other screening for malignant neoplasm of breast: Secondary | ICD-10-CM

## 2018-05-29 NOTE — Progress Notes (Signed)
No complaints today.   Pap Smear: Pap smear not completed today. Last Pap smear was 04/09/2018 at [facility] and normal. Per patient has no history of an abnormal Pap smear. Last Pap smear result is in Epic.  Physical exam: Breasts Breasts symmetrical. No skin abnormalities bilateral breasts. No nipple retraction bilateral breasts. No nipple discharge bilateral breasts. No lymphadenopathy. No lumps palpated bilateral breasts. No complaints of pain or tenderness on exam. Referred patient to the [facility] for a screening mammogram. Appointment scheduled for Thursday, May 29, 2018 at 1050.        Pelvic/Bimanual No Pap smear completed today since last Pap smear was 04/09/2018. Pap smear not indicated per B[facility]CP guidelines.   Smoking History: Patient has never smoked.  Patient Navigation: Patient education provided. Access to services provided for patient through [facility] program. Spanish interpreter provided.   Colorectal Cancer Screening: Patient had a colonoscopy completed 10/18/2016. No complaints today. FIT Test given to patient to complete and return to B[facility]CP.  Breast and Cervical Cancer Risk Assessment: Patient has no family history of breast cancer, known genetic mutations, or radiation treatment to the chest before age 11. Patient has no history of cervical dysplasia, immunocompromised, or DES exposure in-utero.  Risk Assessment    Risk Scores      05/29/2018   Last edited by: Lynnell Dike, LPN   5-year risk: 0.9 %   Lifetime risk: 4.9 %         Used Spanish interpreter Natale Lay from [place].

## 2018-05-29 NOTE — Patient Instructions (Signed)
Explained breast self awareness with Marvis Repress. Patient did not need a Pap smear today due to last Pap smear was 04/09/2018. Let her know [facility]CP will cover Pap smears every 3 years unless has a history of abnormal Pap smears. Referred patient to the [facility] of [facility] for a screening mammogram. Appointment scheduled for Thursday, May 29, 2018 at 1050. Patient aware of appointment and will be there. Let patient the Breast Center know will follow up with her within the next couple weeks with results of mammogram by letter or phone. Marvis Repress verbalized understanding.  Teondra Newburg, Kathaleen Maser, RN 9:52 AM

## 2018-05-30 ENCOUNTER — Encounter (HOSPITAL_COMMUNITY): Payer: Self-pay | Admitting: *Deleted

## 2018-06-04 ENCOUNTER — Other Ambulatory Visit: Payer: Self-pay

## 2018-06-16 LAB — FECAL OCCULT BLOOD, IMMUNOCHEMICAL: Fecal Occult Bld: NEGATIVE

## 2018-06-30 ENCOUNTER — Encounter (HOSPITAL_COMMUNITY): Payer: Self-pay

## 2018-07-30 ENCOUNTER — Ambulatory Visit: Payer: Self-pay | Attending: Nurse Practitioner | Admitting: Nurse Practitioner

## 2018-07-30 ENCOUNTER — Encounter: Payer: Self-pay | Admitting: Nurse Practitioner

## 2018-07-30 ENCOUNTER — Other Ambulatory Visit: Payer: Self-pay

## 2018-07-30 VITALS — BP 137/87 | HR 63 | Temp 98.7°F | Ht <= 58 in | Wt 115.6 lb

## 2018-07-30 DIAGNOSIS — I10 Essential (primary) hypertension: Secondary | ICD-10-CM | POA: Insufficient documentation

## 2018-07-30 DIAGNOSIS — Z79899 Other long term (current) drug therapy: Secondary | ICD-10-CM | POA: Insufficient documentation

## 2018-07-30 DIAGNOSIS — Z7901 Long term (current) use of anticoagulants: Secondary | ICD-10-CM | POA: Insufficient documentation

## 2018-07-30 DIAGNOSIS — E782 Mixed hyperlipidemia: Secondary | ICD-10-CM | POA: Insufficient documentation

## 2018-07-30 DIAGNOSIS — R102 Pelvic and perineal pain: Secondary | ICD-10-CM | POA: Insufficient documentation

## 2018-07-30 LAB — POCT URINALYSIS DIPSTICK
Bilirubin, UA: NEGATIVE
Glucose, UA: NEGATIVE
Ketones, UA: NEGATIVE
Leukocytes, UA: NEGATIVE
NITRITE UA: NEGATIVE
Protein, UA: NEGATIVE
RBC UA: NEGATIVE
Spec Grav, UA: 1.01 (ref 1.010–1.025)
Urobilinogen, UA: 0.2 E.U./dL
pH, UA: 7.5 (ref 5.0–8.0)

## 2018-07-30 MED ORDER — ATORVASTATIN CALCIUM 10 MG PO TABS: 10.0000 mg | ORAL_TABLET | Freq: Every day | ORAL | 1 refills | Status: DC

## 2018-07-30 NOTE — Progress Notes (Signed)
Sandra Gardner was seen today for follow-up.  Diagnoses and all orders for this visit:  Essential hypertension -     CBC -     CMP14+EGFR Continue all antihypertensives as prescribed.  Remember to bring in your blood pressure log with you for your follow up appointment.  DASH/Mediterranean Diets are healthier choices for HTN.    Pelvic pressure in female -     US PELVIC COMPLETE WITH TRANSVAGINAL; Future -     Urinalysis Dipstick  Mixed hyperlipidemia -     atorvastatin (LIPITOR) 10 MG tablet; Take 1 tablet (10 mg total) by mouth daily. INSTRUCTIONS: Work on a low fat, heart healthy diet and participate in regular aerobic exercise program by working out at least 150 minutes per week; 5 days a week-30 minutes per day. Avoid red meat, fried foods. junk foods, sodas, sugary drinks, unhealthy snacking, alcohol and smoking.  Drink at least 48oz of water per day and monitor your carbohydrate intake daily.     Patient has been counseled on age-appropriate routine health concerns for screening and prevention. These are reviewed and up-to-date. Referrals have been placed accordingly. Immunizations are up-to-date or declined.    Subjective:   Chief Complaint  Patient presents with  . Follow-up    HTN   HPI Sandra Gardner 63 y.o. female presents to office today for follow up of HTN. VRI was used to communicate directly with patient for the entire encounter including providing detailed patient instructions.    Essential Hypertension Currently taking amlodipine 9m daily. Blood pressure is well controlled today. She endorses medication compliance. Denies chest pain, shortness of breath, palpitations, lightheadedness, dizziness, headaches or BLE edema.  BP Readings from Last 3 Encounters:  07/30/18 137/87  05/29/18 118/78  05/15/18 134/70   Vaginal Pressure Chronic. Endorses dyspareunia and a sensation of "something coming out my vagina".  She does endorse a possible previous history of bladder  or uterine prolapse but can not recall which diagnosis was made.  She does endorse small drops of visible blood on the tissue after urination. Denies any bilateral flank pain. Her recent PAP was negative for cervical cancer but positive for BV for which she was treated. Declines pelvic exam today. Will order [place]   Review of Systems  Constitutional: Negative for fever, malaise/fatigue and weight loss.  HENT: Negative.  Negative for nosebleeds.   Eyes: Negative.  Negative for blurred vision, double vision and photophobia.  Respiratory: Negative.  Negative for cough and shortness of breath.   Cardiovascular: Negative.  Negative for chest pain, palpitations and leg swelling.  Gastrointestinal: Negative.  Negative for heartburn, nausea and vomiting.  Genitourinary:       SEE HPI  Musculoskeletal: Negative.  Negative for myalgias.  Neurological: Negative.  Negative for dizziness, focal weakness, seizures and headaches.  Psychiatric/Behavioral: Negative.  Negative for suicidal ideas.    Past Medical History:  Diagnosis Date  . Cystocele   . Hypertension   . Urinary incontinence   . Vaginal atrophy     Past Surgical History:  Procedure Laterality Date  . CESAREAN SECTION     3 previous  . CHOLECYSTECTOMY    . COLONOSCOPY N/A 10/18/2016   Procedure: COLONOSCOPY;  Surgeon: SDanie Binder MD;  [facility];  Service: Endoscopy;  Laterality: N/A;  1230   . ENDOMETRIAL ABLATION  2012  . SHOULDER SURGERY Right   . TUBAL LIGATION     BTL--  [id number]   Family History  Problem  Relation Age of Onset  . Diabetes Father   . Hypertension Paternal Grandmother   . Thyroid disease Sister     Social History Reviewed with no changes to be made today.   Outpatient Medications Prior to Visit  Medication Sig Dispense Refill  . amLODipine (NORVASC) 5 MG tablet Take 1 tablet (5 mg total) by mouth daily. 90 tablet 1  . vitamin E 400 UNIT capsule Take 400 Units by mouth daily.    Marland Kitchen  atorvastatin (LIPITOR) 10 MG tablet Take 1 tablet (10 mg total) by mouth daily. 30 tablet 2  . Naproxen Sodium (ALEVE PO) Take by mouth.     No facility-administered medications prior to visit.     Allergies  Allergen Reactions  . Apple Swelling  . Cherry     swelling  . Citrus Aurantium [Citrus]        Objective:    BP 137/87 (BP Location: Left Arm, Patient Position: Sitting, Cuff Size: Normal)   Pulse 63   Temp 98.7 F (37.1 C) (Oral)   Ht 4' 9"  (1.448 m)   Wt 115 lb 9.6 oz (52.4 kg)   SpO2 96%   BMI 25.02 kg/m  Wt Readings from Last 3 Encounters:  07/30/18 115 lb 9.6 oz (52.4 kg)  05/29/18 115 lb (52.2 kg)  05/15/18 116 lb (52.6 kg)    Physical Exam Vitals signs and nursing note reviewed.  Constitutional:      Appearance: She is well-developed.  HENT:     Head: Normocephalic and atraumatic.  Neck:     Musculoskeletal: Normal range of motion.  Cardiovascular:     Rate and Rhythm: Normal rate and regular rhythm.     Heart sounds: Normal heart sounds. No murmur. No friction rub. No gallop.   Pulmonary:     Effort: Pulmonary effort is normal. No tachypnea or respiratory distress.     Breath sounds: Normal breath sounds. No decreased breath sounds, wheezing, rhonchi or rales.  Chest:     Chest wall: No tenderness.  Abdominal:     General: Bowel sounds are normal.     Palpations: Abdomen is soft.  Musculoskeletal: Normal range of motion.  Skin:    General: Skin is warm and dry.  Neurological:     Mental Status: She is alert and oriented to person, place, and time.     Coordination: Coordination normal.  Psychiatric:        Behavior: Behavior normal. Behavior is cooperative.        Thought Content: Thought content normal.        Judgment: Judgment normal.        Patient has been counseled extensively about nutrition and exercise as well as the importance of adherence with medications and regular follow-up. The patient was given clear instructions to go to  ER or return to medical center if symptoms don't improve, worsen or new problems develop. The patient verbalized understanding.   Follow-up: Return in about 3 months (around 10/29/2018) for BP recheck.   Gildardo Pounds, FNP-BC [facility], [place]   07/30/2018, 1:37 PM

## 2018-07-30 NOTE — Patient Instructions (Signed)
Prolapso de los órganos de la pelvis  Pelvic Organ Prolapse  Un prolapso de los órganos de la pelvis es el estiramiento, la dilatación o la caída de los órganos de la pelvis a una posición anormal. Esto sucede cuando los músculos y tejidos que rodean y sostienen las estructuras pélvicas se debilitan o estiran. El prolapso de los órganos de la pelvis puede involucrar a los siguientes órganos:  · Vagina (prolapso vaginal).  · Útero (prolapso uterino).  · Vejiga (cistocele).  · Recto (rectocele).  · Intestinos (enterocele).  Cuando están comprometidos órganos diferentes a la vagina, estos a menudo se protruyen hacia adentro de la vagina o hacia afuera de la vagina, según la gravedad del prolapso.  ¿Cuáles son las causas?  Esta afección puede ser causada por lo siguiente:  · Embarazo, trabajo de parto y parto.  · Cirugía de pelvis anterior.  · Una disminución de la producción de estrógeno debido a la menopausia.  · Levantar sistemáticamente objetos que pesan más de 50 libras (23 kg).  · Obesidad.  · Problemas para defecar a largo plazo (estreñimiento crónico).  · Una tos que dura mucho tiempo (crónica).  · Acumulación de líquido en el abdomen debido a ciertas enfermedades y afecciones.  ¿Cuáles son los signos o los síntomas?  Los síntomas de esta afección incluyen los siguientes:  · Escape de orina (pérdida del control de la vejiga) al toser, estornudar, esforzarse y ejercitar (incontinencia urinaria de esfuerzo). Esto puede empeorar inmediatamente después del parto. Puede mejorar gradualmente con el tiempo.  · Sensación de presión en la pelvis o la vagina. Esta presión puede aumentar al toser o al defecar.  · Un bulto que sobresale de la abertura de la vagina.  · Dificultad para orinar o defecar.  · Dolor en la parte inferior de la espalda.  · Dolor, molestias o desinterés en tener sexo.  · Infecciones reiteradas en la vejiga (infecciones de las vías urinarias).  · Dificultad para insertar un tampón.  En algunas  personas, esta afección no causa síntomas.  ¿Cómo se diagnostica?  Esta afección puede diagnosticarse con un examen de la vagina y un examen rectal. Durante el examen, pueden pedirle que tosa y que haga un esfuerzo mientras está acostada, sentada y de pie. El médico determinará si se requieren otros estudios, como las pruebas de la función de la vejiga.  ¿Cómo se trata?  El tratamiento de esta afección puede depender de los síntomas. El tratamiento puede incluir lo siguiente:  · Cambios en el estilo de vida, como modificar su dieta.  · Vaciar la vejiga en horarios programados (terapia de entrenamiento de la vejiga). Esto puede ayudar a reducir o evitar la incontinencia urinaria.  · Estrógeno. Si el prolapso es leve, el estrógeno puede ayudar al aumentar la fuerza y el tono de los músculos del piso pélvico.  · Ejercicios de Kegel. Estos ejercicios pueden ayudar en los casos leves de prolapso al estirar y tensar los músculos del piso pélvico.  · Un dispositivo flexible que ayuda a sostener las paredes vaginales y mantener los órganos de la pelvis en su lugar (pesario). El médico insertará este dispositivo en la vagina.  · Cirugía. Esta es a menudo la única forma de tratamiento de los casos graves de prolapso.  Siga estas indicaciones en su casa:  · Evite ingerir bebidas que contengan cafeína o alcohol.  · Aumente el consumo de alimentos ricos en fibra. Esto puede ayudar a disminuir el estreñimiento y a evitar realizar esfuerzos durante las deposiciones.  ·   Pierda peso si se lo recomienda el médico.  · Use una toalla higiénica o pañales para adultos si tiene incontinencia urinaria.  · Evite levantar objetos pesados y realizar esfuerzos mientras se ejercita o trabaja. No contenga la respiración cuando levante pesas y realice ejercicios de intensidad leve a moderada. Limite sus actividades como se lo haya indicado el médico.  · Realice los ejercicios de Kegel como se lo haya indicado el médico. Haga lo  siguiente:  ? Contraiga con fuerza los músculos del suelo pélvico. Debe sentir que se eleva y comprime el área rectal y tensión en el área vaginal. Mantenga el estómago, las nalgas y las piernas relajadas.  ? Mantenga los músculos tensos durante 10 segundos como máximo.  ? Relaje los músculos.  ? Repita este ejercicio 50 veces al día o las veces que le haya indicado su médico. Continúe haciendo este ejercicio durante al menos 4 a 6 semanas o el tiempo que le haya indicado su médico.  · Tome los medicamentos de venta libre y los recetados solamente como se lo haya indicado el médico.  · Si tiene puesto un pesario, cuídelo como se lo haya indicado su médico.  · Concurra a todas las visitas de seguimiento como se lo haya indicado el médico. Esto es importante.  Comuníquese con un médico si:  · Tiene síntomas que interfieren con sus actividades diarias o su vida sexual.  · Necesita medicamentos para aliviar las molestias.  · Observa un sangrado proveniente de la vagina no relacionado con su menstruación.  · Tiene fiebre.  · Siente dolor o sangra al orinar.  · Sangra al defecar.  · Pierde orina durante el sexo.  · Tiene estreñimiento crónico.  · Su pesario se sale de lugar.  · Tiene secreción vaginal con mal olor.  · Tiene un dolor bajo e inusual en el abdomen.  Resumen  · Un prolapso de los órganos de la pelvis es el estiramiento, la dilatación o la caída de los órganos de la pelvis a una posición anormal. Esto sucede cuando los músculos y tejidos que rodean y sostienen las estructuras pélvicas se debilitan o estiran.  · Cuando están comprometidos órganos diferentes a la vagina, estos a menudo se protruyen hacia adentro de la vagina o hacia afuera de la vagina, según la gravedad del prolapso.  · En la mayoría de los casos, esta afección debe tratarse solo si produce síntomas. El tratamiento puede incluir cambios en el estilo de vida, estrógeno, ejercicios de Kegel, la inserción de un pesario o cirugía.  · Evite levantar  objetos pesados y realizar esfuerzos mientras se ejercita o trabaja. No contenga la respiración cuando levante pesas y realice ejercicios de intensidad leve a moderada. Limite sus actividades como se lo haya indicado el médico.  Esta información no tiene como fin reemplazar el consejo del médico. Asegúrese de hacerle al médico cualquier pregunta que tenga.  Document Released: 02/03/2014 Document Revised: 09/19/2017 Document Reviewed: 09/19/2017  Elsevier Interactive Patient Education © 2019 Elsevier Inc.

## 2018-07-31 LAB — CMP14+EGFR
ALT: 20 IU/L (ref 0–32)
AST: 26 IU/L (ref 0–40)
Albumin/Globulin Ratio: 1.5 (ref 1.2–2.2)
Albumin: 4.3 g/dL (ref 3.6–4.8)
Alkaline Phosphatase: 87 IU/L (ref 39–117)
BUN/Creatinine Ratio: 21 (ref 12–28)
BUN: 12 mg/dL (ref 8–27)
Bilirubin Total: 0.5 mg/dL (ref 0.0–1.2)
CALCIUM: 9.6 mg/dL (ref 8.7–10.3)
CO2: 24 mmol/L (ref 20–29)
Chloride: 97 mmol/L (ref 96–106)
Creatinine, Ser: 0.56 mg/dL — ABNORMAL LOW (ref 0.57–1.00)
GFR, EST AFRICAN AMERICAN: 116 mL/min/{1.73_m2} (ref >59)
GFR, EST NON AFRICAN AMERICAN: 101 mL/min/{1.73_m2} (ref >59)
GLUCOSE: 120 mg/dL — AB (ref 65–99)
Globulin, Total: 2.8 g/dL (ref 1.5–4.5)
Potassium: 4 mmol/L (ref 3.5–5.2)
Sodium: 137 mmol/L (ref 134–144)
TOTAL PROTEIN: 7.1 g/dL (ref 6.0–8.5)

## 2018-07-31 LAB — CBC
Hematocrit: 42.4 % (ref 34.0–46.6)
Hemoglobin: 14.5 g/dL (ref 11.1–15.9)
MCH: 30.3 pg (ref 26.6–33.0)
MCHC: 34.2 g/dL (ref 31.5–35.7)
MCV: 89 fL (ref 79–97)
PLATELETS: 254 10*3/uL (ref 150–450)
RBC: 4.79 x10E6/uL (ref 3.77–5.28)
RDW: 12.8 % (ref 11.7–15.4)
WBC: 9.4 10*3/uL (ref 3.4–10.8)

## 2018-08-04 ENCOUNTER — Telehealth: Payer: Self-pay | Admitting: Nurse Practitioner

## 2018-08-04 ENCOUNTER — Ambulatory Visit (HOSPITAL_COMMUNITY): Admission: RE | Admit: 2018-08-04 | Payer: Self-pay | Source: Ambulatory Visit

## 2018-08-04 NOTE — Telephone Encounter (Signed)
Patient wanted to get an update on her [place] order because her appointment was canceled. Please follow up.

## 2018-08-06 NOTE — Telephone Encounter (Signed)
Patient was called and informed of schedule [place] appt. Patient understood.  Patient was also given [place] instructions.

## 2018-08-06 NOTE — Telephone Encounter (Signed)
Appontment scheduled for ultrasound. Will inform patient of appointment. Pt advised to call scheduling to make appointment in the future if she needs to reschedule. Message [facility] by [facility]Galindo Espejel.

## 2018-08-07 ENCOUNTER — Other Ambulatory Visit: Payer: Self-pay | Admitting: Nurse Practitioner

## 2018-08-07 DIAGNOSIS — N814 Uterovaginal prolapse, unspecified: Secondary | ICD-10-CM

## 2018-08-13 ENCOUNTER — Telehealth: Payer: Self-pay | Admitting: Nurse Practitioner

## 2018-08-13 ENCOUNTER — Ambulatory Visit (HOSPITAL_COMMUNITY): Payer: Self-pay

## 2018-08-13 ENCOUNTER — Ambulatory Visit: Payer: Self-pay | Admitting: Nurse Practitioner

## 2018-08-13 NOTE — Telephone Encounter (Signed)
Pt missed her first [place] appointment. A call on her behalf was placed to reschedule her [place]  Appointment. She was informed of the appointment and verbalized understanding. She missed the appointment today, she will need to call and schedule appointment with the scheduling department to find a convenient time for her so she does not  No show to future appointments.  Please advise her to call [phone].

## 2018-08-13 NOTE — Telephone Encounter (Signed)
Patient called because she was expecting to be prescribed BP medication at her last visit but only received her cholesterol prescription. Please follow up.

## 2018-08-13 NOTE — Telephone Encounter (Signed)
Pt came in and is requesting to be called to reschedule her US PELVIC COMPLETE WITH TRANSVAGINAL  Called radiology department and its an error in the order

## 2018-08-15 ENCOUNTER — Other Ambulatory Visit: Payer: Self-pay | Admitting: *Deleted

## 2018-08-15 DIAGNOSIS — I10 Essential (primary) hypertension: Secondary | ICD-10-CM

## 2018-08-15 MED ORDER — AMLODIPINE BESYLATE 5 MG PO TABS: 5.0000 mg | ORAL_TABLET | Freq: Every day | ORAL | 1 refills | Status: DC

## 2018-08-15 NOTE — Telephone Encounter (Signed)
Rx RF sent to [facility] pharmacy.

## 2018-08-18 ENCOUNTER — Ambulatory Visit: Payer: Self-pay | Attending: Nurse Practitioner

## 2018-08-26 ENCOUNTER — Ambulatory Visit: Payer: Self-pay | Admitting: Nurse Practitioner

## 2018-08-27 ENCOUNTER — Ambulatory Visit: Payer: Self-pay | Attending: Family Medicine

## 2018-09-04 ENCOUNTER — Telehealth: Payer: Self-pay

## 2018-09-04 ENCOUNTER — Ambulatory Visit (INDEPENDENT_AMBULATORY_CARE_PROVIDER_SITE_OTHER): Payer: Self-pay | Admitting: Obstetrics and Gynecology

## 2018-09-04 ENCOUNTER — Encounter: Payer: Self-pay | Admitting: Obstetrics and Gynecology

## 2018-09-04 VITALS — BP 147/71 | HR 66 | Ht <= 58 in | Wt 113.0 lb

## 2018-09-04 DIAGNOSIS — R102 Pelvic and perineal pain: Secondary | ICD-10-CM

## 2018-09-04 DIAGNOSIS — N952 Postmenopausal atrophic vaginitis: Secondary | ICD-10-CM | POA: Insufficient documentation

## 2018-09-04 DIAGNOSIS — N3020 Other chronic cystitis without hematuria: Secondary | ICD-10-CM

## 2018-09-04 MED ORDER — ESTRADIOL 0.1 MG/GM VA CREA: 2.0000 g | TOPICAL_CREAM | VAGINAL | 2 refills | Status: DC

## 2018-09-04 MED ORDER — NITROFURANTOIN MONOHYD MACRO 100 MG PO CAPS: 100.0000 mg | ORAL_CAPSULE | Freq: Two times a day (BID) | ORAL | 0 refills | Status: DC

## 2018-09-04 NOTE — Progress Notes (Signed)
Patient ID: Sandra Gardner, female   DOB: 09-03-1955, 63 y.o.   MRN: [id number] Pt here for chronic pelvic and painful intercourse for the last several years. Has progressively worsen over the last several months. She voids every 2 hrs during the day but none during the night. Denies any bowel dysfunction Menopausal since age 29 Pap last yr, nl per pt SAB x 1 C section x 3 H/O HTN and hyperlipidemia, controlled an d followed by PCP  PE AF VSS Lungs clear Heart RRR Abd soft + BS GU Nl EGBUS, vaginal and uterual  meatus atrophy noted, cervix no lesions, + bladder tenderness, uterus small non tender, no masses, exam limited by pt discomfort.  A/P Pelvic pain        Chronic cystitis        Vaginal atrophy  Suspect pt's Sx are related to chronic cystitis.  Discussed with pt and husband. I think the vaginal atrophy is playing a small role in her Sx as well. Will check GYN U/S. Long treatment of Macrobid. Estrace vaginal cream three times a week. Reframe from IC until F/U appt. F/U in 6 weeks. Video interrupter used during today's visit.

## 2018-09-04 NOTE — Progress Notes (Signed)
Patient ID: Sandra Gardner, female   DOB: [birth year], 63 y.o.   MRN: [id number]   Called to get patient patient scheduled for pelvic ultrasound [phone]). The schedule was not open after the 23rd, so they will give me a call back to get patient scheduled once the it is open.

## 2018-09-04 NOTE — Progress Notes (Signed)
Burning sensation inside Vagina

## 2018-09-04 NOTE — Telephone Encounter (Signed)
Called pt to advise of [place] appt on 09/17/18 @ 1pm & to arrive at 12:45 with full bladder.No answer, left VM.

## 2018-09-17 ENCOUNTER — Ambulatory Visit (HOSPITAL_COMMUNITY)
Admission: RE | Admit: 2018-09-17 | Discharge: 2018-09-17 | Disposition: A | Discharge status: Home / Self Care | Payer: Self-pay | Source: Ambulatory Visit | Attending: Obstetrics and Gynecology | Admitting: Obstetrics and Gynecology

## 2018-09-17 DIAGNOSIS — R102 Pelvic and perineal pain: Secondary | ICD-10-CM | POA: Insufficient documentation

## 2018-09-22 ENCOUNTER — Telehealth: Payer: Self-pay | Admitting: *Deleted

## 2018-09-22 NOTE — Telephone Encounter (Signed)
Called patient with Interpreter Lorinda Creed and left message we are calling with some information; please call our office.

## 2018-09-22 NOTE — Telephone Encounter (Signed)
-----   Message from Hermina Staggers, MD sent at 09/18/2018 10:44 AM EST ----- Please let pt know that her GYN U/S was normal. Thanks Casimiro Needle

## 2018-09-22 NOTE — Telephone Encounter (Signed)
Sandra Gardner called back to find out results- due to her limited English - I offered to call her back with interpreter. I called her with [facility] Interpreter [phone] and informed her per Dr.Ervin her gyn ultrasound was normal.  She voices understanding.

## 2018-10-01 ENCOUNTER — Telehealth: Payer: Self-pay | Admitting: Nurse Practitioner

## 2018-10-01 NOTE — Telephone Encounter (Signed)
Please print Cafa Letter

## 2018-10-02 NOTE — Telephone Encounter (Signed)
LVM  that her CAFA has been approve an the letter will be sent via mail

## 2018-10-27 ENCOUNTER — Ambulatory Visit: Payer: Self-pay | Attending: Nurse Practitioner | Admitting: Nurse Practitioner

## 2018-10-27 ENCOUNTER — Other Ambulatory Visit: Payer: Self-pay

## 2018-10-27 NOTE — Progress Notes (Signed)
   Assessment & Plan:  There are no diagnoses linked to this encounter.  Patient has been counseled on age-appropriate routine health concerns for screening and prevention. These are reviewed and up-to-date. Referrals have been placed accordingly. Immunizations are up-to-date or declined.    Subjective:  No chief complaint on file.  HPI Sandra Gardner 63 y.o. female had a telehealth visit today.     ROS  Past Medical History:  Diagnosis Date  . Cystocele   . Hypertension   . Urinary incontinence   . Vaginal atrophy     Past Surgical History:  Procedure Laterality Date  . CESAREAN SECTION     3 previous  . CHOLECYSTECTOMY    . COLONOSCOPY N/A 10/18/2016   Procedure: COLONOSCOPY;  Surgeon: West Bali, MD;  [facility];  Service: Endoscopy;  Laterality: N/A;  1230   . ENDOMETRIAL ABLATION  2012  . SHOULDER SURGERY Right   . TUBAL LIGATION     BTL-  [id number]    Family History  Problem Relation Age of Onset  . Diabetes Father   . Hypertension Paternal Grandmother   . Thyroid disease Sister     Social History Reviewed with no changes to be made today.   Outpatient Medications Prior to Visit  Medication Sig Dispense Refill  . amLODipine (NORVASC) 5 MG tablet Take 1 tablet (5 mg total) by mouth daily. 90 tablet 1  . atorvastatin (LIPITOR) 10 MG tablet Take 1 tablet (10 mg total) by mouth daily. 90 tablet 1  . estradiol (ESTRACE VAGINAL) 0.1 MG/GM vaginal cream Place 2 g vaginally 3 (three) times a week. 42.5 g 2  . Naproxen Sodium (ALEVE PO) Take by mouth.    . nitrofurantoin, macrocrystal-monohydrate, (MACROBID) 100 MG capsule Take 1 capsule (100 mg total) by mouth 2 (two) times daily. After completion of above, take 1 tablet qhs until gone 35 capsule 0  . vitamin E 400 UNIT capsule Take 400 Units by mouth daily.     No facility-administered medications prior to visit.     Allergies  Allergen Reactions  . Apple Swelling  . Cherry     swelling   . Citrus Aurantium [Citrus]        Objective:    There were no vitals taken for this visit. Wt Readings from Last 3 Encounters:  09/04/18 113 lb (51.3 kg)  07/30/18 115 lb 9.6 oz (52.4 kg)  05/29/18 115 lb (52.2 kg)    Physical Exam       Patient has been counseled extensively about nutrition and exercise as well as the importance of adherence with medications and regular follow-up. The patient was given clear instructions to go to ER or return to medical center if symptoms don't improve, worsen or new problems develop. The patient verbalized understanding.   Follow-up: No follow-ups on file.   Claiborne Rigg, FNP-BC [facility] [place], [place] [phone]   10/27/2018, 9:17 AM This encounter was created in error - please disregard.

## 2018-11-12 ENCOUNTER — Telehealth: Payer: Self-pay | Admitting: Nurse Practitioner

## 2018-12-03 ENCOUNTER — Encounter: Payer: Self-pay | Admitting: Nurse Practitioner

## 2018-12-03 ENCOUNTER — Other Ambulatory Visit: Payer: Self-pay

## 2018-12-03 ENCOUNTER — Ambulatory Visit: Payer: Self-pay | Attending: Nurse Practitioner | Admitting: Nurse Practitioner

## 2018-12-03 DIAGNOSIS — E782 Mixed hyperlipidemia: Secondary | ICD-10-CM

## 2018-12-03 DIAGNOSIS — I10 Essential (primary) hypertension: Secondary | ICD-10-CM

## 2018-12-03 MED ORDER — ATORVASTATIN CALCIUM 10 MG PO TABS: 10.0000 mg | ORAL_TABLET | Freq: Every day | ORAL | 1 refills | Status: DC

## 2018-12-03 MED ORDER — AMLODIPINE BESYLATE 5 MG PO TABS: 5.0000 mg | ORAL_TABLET | Freq: Every day | ORAL | 1 refills | Status: DC

## 2018-12-03 NOTE — Progress Notes (Signed)
Virtual Visit via Telephone Note Due to national recommendations of social distancing due to COVID 19, telehealth visit is felt to be most appropriate for this patient at this time.  I discussed the limitations, risks, security and privacy concerns of performing an evaluation and management service by telephone and the availability of in person appointments. I also discussed with the patient that there may be a patient responsible charge related to this service. The patient expressed understanding and agreed to proceed.    I connected with Frankey Poot on 12/03/18  at   2:50 PM EDT  EDT by telephone and verified that I am speaking with the correct person using two identifiers.   Consent I discussed the limitations, risks, security and privacy concerns of performing an evaluation and management service by telephone and the availability of in person appointments. I also discussed with the patient that there may be a patient responsible charge related to this service. The patient expressed understanding and agreed to proceed.   Location of Patient: Private  Residence   Location of Provider: [facility] [place] [facility]    Persons participating in Telemedicine visit: Bertram Denver FNP-BC [facility] [place] CMA Lizette Goldwater  Spanish Interpreter Lars Mage [phone]   History of Present Illness: Telemedicine visit for: Follow up to HTN   Essential Hypertension Chronic. She does not monitor her blood pressure at home. I have instructed that she try to purchase a monitor from the local store.  Denies chest pain, shortness of breath, palpitations, lightheadedness, dizziness, headaches or BLE edema.  BP Readings from Last 3 Encounters:  09/04/18 (!) 147/71  07/30/18 137/87  05/29/18 118/78    Past Medical History:  Diagnosis Date  . Cystocele   . Hypertension   . Urinary incontinence   . Vaginal atrophy     Past Surgical History:  Procedure Laterality Date  . CESAREAN  SECTION     3 previous  . CHOLECYSTECTOMY    . COLONOSCOPY N/A 10/18/2016   Procedure: COLONOSCOPY;  Surgeon: West Bali, MD;  [facility];  Service: Endoscopy;  Laterality: N/A;  1230   . ENDOMETRIAL ABLATION  2012  . SHOULDER SURGERY Right   . TUBAL LIGATION     BTL-  [id number]    Family History  Problem Relation Age of Onset  . Diabetes Father   . Hypertension Paternal Grandmother   . Thyroid disease Sister     Social History   Socioeconomic History  . Marital status: Married    Spouse name: Not on file  . Number of children: Not on file  . Years of education: Not on file  . Highest education level: Not on file  Occupational History  . Not on file  Social Needs  . Financial resource strain: Not on file  . Food insecurity:    Worry: Not on file    Inability: Not on file  . Transportation needs:    Medical: Not on file    Non-medical: Not on file  Tobacco Use  . Smoking status: Never Smoker  . Smokeless tobacco: Never Used  Substance and Sexual Activity  . Alcohol use: No  . Drug use: No  . Sexual activity: Yes    Birth control/protection: Post-menopausal, Surgical  Lifestyle  . Physical activity:    Days per week: Not on file    Minutes per session: Not on file  . Stress: Not on file  Relationships  . Social connections:  Talks on phone: Not on file    Gets together: Not on file    Attends religious service: Not on file    Active member of club or organization: Not on file    Attends meetings of clubs or organizations: Not on file    Relationship status: Not on file  Other Topics Concern  . Not on file  Social History Narrative  . Not on file     Observations/Objective: Awake, alert and oriented x 3   Review of Systems  Constitutional: Negative for fever, malaise/fatigue and weight loss.  HENT: Negative.  Negative for nosebleeds.   Eyes: Negative.  Negative for blurred vision, double vision and photophobia.  Respiratory: Negative.   Negative for cough and shortness of breath.   Cardiovascular: Negative.  Negative for chest pain, palpitations and leg swelling.  Gastrointestinal: Negative.  Negative for heartburn, nausea and vomiting.  Musculoskeletal: Negative.  Negative for myalgias.  Neurological: Negative.  Negative for dizziness, focal weakness, seizures and headaches.  Psychiatric/Behavioral: Negative.  Negative for suicidal ideas.    Assessment and Plan: Diagnoses and all orders for this visit:  Essential hypertension -     amLODipine (NORVASC) 5 MG tablet; Take 1 tablet (5 mg total) by mouth daily. Continue all antihypertensives as prescribed.  Remember to bring in your blood pressure log with you for your follow up appointment.  DASH/Mediterranean Diets are healthier choices for HTN.    Mixed hyperlipidemia -     atorvastatin (LIPITOR) 10 MG tablet; Take 1 tablet (10 mg total) by mouth daily. INSTRUCTIONS: Work on a low fat, heart healthy diet and participate in regular aerobic exercise program by working out at least 150 minutes per week; 5 days a week-30 minutes per day. Avoid red meat, fried foods. junk foods, sodas, sugary drinks, unhealthy snacking, alcohol and smoking.  Drink at least 48oz of water per day and monitor your carbohydrate intake daily.    Follow Up Instructions Return in about 2 months (around 02/02/2019) for BP recheck, HTN.     I discussed the assessment and treatment plan with the patient. The patient was provided an opportunity to ask questions and all were answered. The patient agreed with the plan and demonstrated an understanding of the instructions.   The patient was advised to call back or seek an in-person evaluation if the symptoms worsen or if the condition fails to improve as anticipated.  I provided 18 minutes of non-face-to-face time during this encounter including median intraservice time, reviewing previous notes, labs, imaging, medications and explaining diagnosis and  management.  Claiborne RiggZelda W Jostin Rue, FNP-BC

## 2018-12-29 ENCOUNTER — Other Ambulatory Visit: Payer: Self-pay

## 2018-12-29 ENCOUNTER — Ambulatory Visit (INDEPENDENT_AMBULATORY_CARE_PROVIDER_SITE_OTHER): Payer: Self-pay | Admitting: Obstetrics and Gynecology

## 2018-12-29 ENCOUNTER — Encounter: Payer: Self-pay | Admitting: Obstetrics and Gynecology

## 2018-12-29 DIAGNOSIS — N952 Postmenopausal atrophic vaginitis: Secondary | ICD-10-CM

## 2018-12-29 MED ORDER — ESTRADIOL 0.1 MG/GM VA CREA: 2.0000 g | TOPICAL_CREAM | VAGINAL | 5 refills | Status: DC

## 2018-12-29 NOTE — Progress Notes (Signed)
[place] interpreter ID [phone]  I connected with  Sandra Gardner on 12/29/18 at  2:15 PM EDT by telephone and verified that I am speaking with the correct person using two identifiers.   I discussed the limitations, risks, security and privacy concerns of performing an evaluation and management service by telephone and the availability of in person appointments. I also discussed with the patient that there may be a patient responsible charge related to this service. The patient expressed understanding and agreed to proceed.  Alric Seton, [place] 12/29/2018  2:17 PM\

## 2018-12-29 NOTE — Progress Notes (Signed)
   TELEHEALTH VIRTUAL GYNECOLOGY VISIT ENCOUNTER NOTE  I connected with Sandra Gardner on 12/29/18 at  2:15 PM EDT by telephone at home and verified that I am speaking with the correct person using two identifiers.   I discussed the limitations, risks, security and privacy concerns of performing an evaluation and management service by telephone and the availability of in person appointments. I also discussed with the patient that there may be a patient responsible charge related to this service. The patient expressed understanding and agreed to proceed.   History:  Sandra Gardner is a 63 y.o. [phone] female being evaluated today for follow up for pelvic pain and vaginal atrophy. See prior office visit . Was treated with Macrobid and Estrace cream. She reports feeling much better only an occasional pain now.        Past Medical History:  Diagnosis Date  . Cystocele   . Hypertension   . Urinary incontinence   . Vaginal atrophy    Past Surgical History:  Procedure Laterality Date  . CESAREAN SECTION     3 previous  . CHOLECYSTECTOMY    . COLONOSCOPY N/A 10/18/2016   Procedure: COLONOSCOPY;  Surgeon: Danie Binder, MD;  [facility];  Service: Endoscopy;  Laterality: N/A;  1230   . ENDOMETRIAL ABLATION  2012  . SHOULDER SURGERY Right   . TUBAL LIGATION     BTL-  [id number]   The following portions of the patient's history were reviewed and updated as appropriate: allergies, current medications, past family history, past medical history, past social history, past surgical history and problem list.      Review of Systems:  Pertinent items noted in HPI and remainder of comprehensive ROS otherwise negative.  Physical Exam:   General:  Alert, oriented and cooperative.   Mental Status: Normal mood and affect perceived. Normal judgment and thought content.  Physical exam deferred due to nature of the encounter  Labs and Imaging No results found for this or any  previous visit (from the past 336 hour(s)). No results found.    Assessment and Plan:     1. Vaginal atrophy  - estradiol (ESTRACE VAGINAL) 0.1 MG/GM vaginal cream; Place 2 g vaginally 3 (three) times a week.  Dispense: 42.5 g; Refill: 5    Spanish interrupter used during today's visit   I discussed the assessment and treatment plan with the patient. The patient was provided an opportunity to ask questions and all were answered. The patient agreed with the plan and demonstrated an understanding of the instructions.   The patient was advised to call back or seek an in-person evaluation/go to the ED if the symptoms worsen or if the condition fails to improve as anticipated.  I provided 10 minutes of non-face-to-face time during this encounter.  F/U in 4 months  Chancy Milroy, MD [facility], [place]

## 2019-02-02 ENCOUNTER — Ambulatory Visit: Payer: Self-pay | Admitting: Nurse Practitioner

## 2019-02-04 ENCOUNTER — Ambulatory Visit: Payer: Self-pay | Attending: Nurse Practitioner | Admitting: Nurse Practitioner

## 2019-02-04 ENCOUNTER — Encounter: Payer: Self-pay | Admitting: Nurse Practitioner

## 2019-02-04 ENCOUNTER — Other Ambulatory Visit: Payer: Self-pay

## 2019-02-04 VITALS — BP 121/83 | HR 63 | Temp 98.8°F | Ht <= 58 in | Wt 118.0 lb

## 2019-02-04 DIAGNOSIS — Z862 Personal history of diseases of the blood and blood-forming organs and certain disorders involving the immune mechanism: Secondary | ICD-10-CM

## 2019-02-04 DIAGNOSIS — I10 Essential (primary) hypertension: Secondary | ICD-10-CM

## 2019-02-04 DIAGNOSIS — R7309 Other abnormal glucose: Secondary | ICD-10-CM

## 2019-02-04 DIAGNOSIS — E782 Mixed hyperlipidemia: Secondary | ICD-10-CM

## 2019-02-04 NOTE — Progress Notes (Signed)
Assessment & Plan:  Sandra Gardner was seen today for follow-up.  Diagnoses and all orders for this visit:  Essential hypertension -     CMP14+EGFR Continue all antihypertensives as prescribed.  Remember to bring in your blood pressure log with you for your follow up appointment.  DASH/Mediterranean Diets are healthier choices for HTN.    Mixed hyperlipidemia -     Lipid panel INSTRUCTIONS: Work on a low fat, heart healthy diet and participate in regular aerobic exercise program by working out at least 150 minutes per week; 5 days a week-30 minutes per day. Avoid red meat, fried foods. junk foods, sodas, sugary drinks, unhealthy snacking, alcohol and smoking.  Drink at least 48oz of water per day and monitor your carbohydrate intake daily.    Elevated glucose -     CMP14+EGFR  History of anemia -     CBC    Patient has been counseled on age-appropriate routine health concerns for screening and prevention. These are reviewed and up-to-date. Referrals have been placed accordingly. Immunizations are up-to-date or declined.    Subjective:   Chief Complaint  Patient presents with  . Follow-up    Pt is here blood pressure check.    HPI Sandra Gardner 63 y.o. female presents to office today for follow up to HTN.    Essential Hypertension Chronic and well controlled. She is taking amlodipine 5 mg daily. Denies chest pain, shortness of breath, palpitations, lightheadedness, dizziness, headaches or BLE edema.  BP Readings from Last 3 Encounters:  02/04/19 121/83  09/04/18 (!) 147/71  07/30/18 137/87    Hyperlipidemia Patient presents for follow up to hyperlipidemia.  She is medication compliant taking 10 mg daily. She is diet compliant and denies statin intolerance including myalgias.  Lab Results  Component Value Date   CHOL 193 03/18/2018   Lab Results  Component Value Date   HDL 54 03/18/2018   Lab Results  Component Value Date   LDLCALC 107 (H) 03/18/2018   Lab  Results  Component Value Date   TRIG 160 (H) 03/18/2018   Lab Results  Component Value Date   CHOLHDL 3.6 03/18/2018    Review of Systems  Constitutional: Negative for fever, malaise/fatigue and weight loss.  HENT: Negative.  Negative for nosebleeds.   Eyes: Negative.  Negative for blurred vision, double vision and photophobia.  Respiratory: Negative.  Negative for cough and shortness of breath.   Cardiovascular: Negative.  Negative for chest pain, palpitations and leg swelling.  Gastrointestinal: Negative.  Negative for heartburn, nausea and vomiting.  Musculoskeletal: Negative.  Negative for myalgias.  Neurological: Negative.  Negative for dizziness, focal weakness, seizures and headaches.  Psychiatric/Behavioral: Negative.  Negative for suicidal ideas.    Past Medical History:  Diagnosis Date  . Cystocele   . Hypertension   . Urinary incontinence   . Vaginal atrophy     Past Surgical History:  Procedure Laterality Date  . CESAREAN SECTION     3 previous  . CHOLECYSTECTOMY    . COLONOSCOPY N/A 10/18/2016   Procedure: COLONOSCOPY;  Surgeon: Danie Binder, MD;  [facility];  Service: Endoscopy;  Laterality: N/A;  1230   . ENDOMETRIAL ABLATION  2012  . SHOULDER SURGERY Right   . TUBAL LIGATION     BTL-  08-4578    Family History  Problem Relation Age of Onset  . Diabetes Father   . Hypertension Paternal Grandmother   . Thyroid disease Sister  Social History Reviewed with no changes to be made today.   Outpatient Medications Prior to Visit  Medication Sig Dispense Refill  . amLODipine (NORVASC) 5 MG tablet Take 1 tablet (5 mg total) by mouth daily. 90 tablet 1  . atorvastatin (LIPITOR) 10 MG tablet Take 1 tablet (10 mg total) by mouth daily. 90 tablet 1  . estradiol (ESTRACE VAGINAL) 0.1 MG/GM vaginal cream Place 2 g vaginally 3 (three) times a week. 42.5 g 5  . Naproxen Sodium (ALEVE PO) Take by mouth.    . vitamin E 400 UNIT capsule Take 400  Units by mouth daily.     No facility-administered medications prior to visit.     Allergies  Allergen Reactions  . Apple Swelling  . Cherry     swelling  . Citrus Aurantium [Citrus]        Objective:    BP 121/83 (BP Location: Left Arm, Patient Position: Sitting, Cuff Size: Normal)   Pulse 63   Temp 98.8 F (37.1 C) (Oral)   Ht _0  (1.448 m)   Wt 118 lb (53.5 kg)   SpO2 95%   BMI 25.53 kg/m  Wt Readings from Last 3 Encounters:  02/04/19 118 lb (53.5 kg)  09/04/18 113 lb (51.3 kg)  07/30/18 115 lb 9.6 oz (52.4 kg)    Physical Exam Vitals signs and nursing note reviewed.  Constitutional:      Appearance: She is well-developed.  HENT:     Head: Normocephalic and atraumatic.  Neck:     Musculoskeletal: Normal range of motion.  Cardiovascular:     Rate and Rhythm: Normal rate and regular rhythm.     Heart sounds: Normal heart sounds. No murmur. No friction rub. No gallop.   Pulmonary:     Effort: Pulmonary effort is normal. No tachypnea or respiratory distress.     Breath sounds: Normal breath sounds. No decreased breath sounds, wheezing, rhonchi or rales.  Chest:     Chest wall: No tenderness.  Abdominal:     General: Bowel sounds are normal.     Palpations: Abdomen is soft.  Musculoskeletal: Normal range of motion.  Skin:    General: Skin is warm and dry.  Neurological:     Mental Status: She is alert and oriented to person, place, and time.     Coordination: Coordination normal.  Psychiatric:        Behavior: Behavior normal. Behavior is cooperative.        Thought Content: Thought content normal.        Judgment: Judgment normal.        Patient has been counseled extensively about nutrition and exercise as well as the importance of adherence with medications and regular follow-up. The patient was given clear instructions to go to ER or return to medical center if symptoms don't improve, worsen or new problems develop. The patient verbalized  understanding.   Follow-up: No follow-ups on file.   Gildardo Pounds, FNP-BC [facility], [place]   02/04/2019, 3:21 PM

## 2019-02-05 LAB — CBC
Hematocrit: 45.2 % (ref 34.0–46.6)
Hemoglobin: 14.6 g/dL (ref 11.1–15.9)
MCH: 29.6 pg (ref 26.6–33.0)
MCHC: 32.3 g/dL (ref 31.5–35.7)
MCV: 92 fL (ref 79–97)
Platelets: 247 10*3/uL (ref 150–450)
RBC: 4.94 x10E6/uL (ref 3.77–5.28)
RDW: 13.4 % (ref 11.7–15.4)
WBC: 10.3 10*3/uL (ref 3.4–10.8)

## 2019-02-05 LAB — LIPID PANEL
Chol/HDL Ratio: 2.3 ratio (ref 0.0–4.4)
Cholesterol, Total: 135 mg/dL (ref 100–199)
HDL: 58 mg/dL (ref >39)
LDL Calculated: 61 mg/dL (ref 0–99)
Triglycerides: 82 mg/dL (ref 0–149)
VLDL Cholesterol Cal: 16 mg/dL (ref 5–40)

## 2019-02-05 LAB — CMP14+EGFR
ALT: 17 IU/L (ref 0–32)
AST: 18 IU/L (ref 0–40)
Albumin/Globulin Ratio: 1.7 (ref 1.2–2.2)
Albumin: 4.7 g/dL (ref 3.8–4.8)
Alkaline Phosphatase: 74 IU/L (ref 39–117)
BUN/Creatinine Ratio: 28 (ref 12–28)
BUN: 15 mg/dL (ref 8–27)
Bilirubin Total: 0.3 mg/dL (ref 0.0–1.2)
CO2: 23 mmol/L (ref 20–29)
Calcium: 10 mg/dL (ref 8.7–10.3)
Chloride: 101 mmol/L (ref 96–106)
Creatinine, Ser: 0.54 mg/dL — ABNORMAL LOW (ref 0.57–1.00)
GFR calc Af Amer: 117 mL/min/{1.73_m2} (ref >59)
GFR calc non Af Amer: 101 mL/min/{1.73_m2} (ref >59)
Globulin, Total: 2.8 g/dL (ref 1.5–4.5)
Glucose: 92 mg/dL (ref 65–99)
Potassium: 4 mmol/L (ref 3.5–5.2)
Sodium: 140 mmol/L (ref 134–144)
Total Protein: 7.5 g/dL (ref 6.0–8.5)

## 2019-02-10 ENCOUNTER — Telehealth: Payer: Self-pay

## 2019-02-10 NOTE — Telephone Encounter (Signed)
CMA attempt to reach patient to inform on lab results.  No answer and left a VM for a call back.  

## 2019-02-10 NOTE — Telephone Encounter (Signed)
-----   Message from Gildardo Pounds, NP sent at 02/05/2019  8:03 PM EDT ----- Labs do not show anemia. Cholesterol levels are normal as well as kidney and liver function.

## 2019-02-11 ENCOUNTER — Telehealth: Payer: Self-pay | Admitting: Nurse Practitioner

## 2019-02-11 NOTE — Telephone Encounter (Signed)
Patient called wanting to get her lab results. Please follow up.  °

## 2019-02-12 NOTE — Telephone Encounter (Signed)
CMA attempt to call patient back for lab results.  No answer and left a VM for call back.

## 2019-05-31 ENCOUNTER — Encounter

## 2019-06-01 ENCOUNTER — Ambulatory Visit: Payer: Self-pay | Admitting: Obstetrics and Gynecology

## 2019-06-04 ENCOUNTER — Other Ambulatory Visit (HOSPITAL_COMMUNITY): Payer: Self-pay | Admitting: *Deleted

## 2019-06-04 DIAGNOSIS — Z1231 Encounter for screening mammogram for malignant neoplasm of breast: Secondary | ICD-10-CM

## 2019-06-25 ENCOUNTER — Encounter: Payer: Self-pay | Admitting: Obstetrics and Gynecology

## 2019-06-25 ENCOUNTER — Other Ambulatory Visit: Payer: Self-pay

## 2019-06-25 ENCOUNTER — Ambulatory Visit (INDEPENDENT_AMBULATORY_CARE_PROVIDER_SITE_OTHER): Payer: Self-pay | Admitting: Obstetrics and Gynecology

## 2019-06-25 DIAGNOSIS — N952 Postmenopausal atrophic vaginitis: Secondary | ICD-10-CM

## 2019-06-25 NOTE — Progress Notes (Signed)
I connected with  Sandra Gardner on 06/25/19 at  8:15 AM EST by telephone and verified that I am speaking with the correct person using two identifiers.   I discussed the limitations, risks, security and privacy concerns of performing an evaluation and management service by telephone and the availability of in person appointments. I also discussed with the patient that there may be a patient responsible charge related to this service. The patient expressed understanding and agreed to proceed.  Bethanne Ginger, [place] 06/25/2019  8:41 AM

## 2019-06-25 NOTE — Progress Notes (Signed)
Patient ID: Sandra Gardner, female   DOB: 11-11-1955, 63 y.o.   MRN: [id number]   TELEHEALTH VIRTUAL GYNECOLOGY VISIT ENCOUNTER NOTE  I connected with Sandra Gardner on 06/25/19 at  8:15 AM EST by telephone at home and verified that I am speaking with the correct person using two identifiers.   I discussed the limitations, risks, security and privacy concerns of performing an evaluation and management service by telephone and the availability of in person appointments. I also discussed with the patient that there may be a patient responsible charge related to this service. The patient expressed understanding and agreed to proceed.   History:  Sandra Gardner is a 63 y.o. [phone] female being evaluated today for vaginal atrophy. She reports doing well with the Estrace vaginal cream. She denies any concerns today      Past Medical History:  Diagnosis Date  . Cystocele   . Hypertension   . Urinary incontinence   . Vaginal atrophy    Past Surgical History:  Procedure Laterality Date  . CESAREAN SECTION     3 previous  . CHOLECYSTECTOMY    . COLONOSCOPY N/A 10/18/2016   Procedure: COLONOSCOPY;  Surgeon: Danie Binder, MD;  [facility];  Service: Endoscopy;  Laterality: N/A;  1230   . ENDOMETRIAL ABLATION  2012  . SHOULDER SURGERY Right   . TUBAL LIGATION     BTL-  [id number]   The following portions of the patient's history were reviewed and updated as appropriate: allergies, current medications, past family history, past medical history, past social history, past surgical history and problem list.     Review of Systems:  Pertinent items noted in HPI and remainder of comprehensive ROS otherwise negative.  Physical Exam:   General:  Alert, oriented and cooperative.   Mental Status: Normal mood and affect perceived. Normal judgment and thought content.  Physical exam deferred due to nature of the encounter  Labs and Imaging No results found for this  or any previous visit (from the past 336 hour(s)). No results found.    Assessment and Plan:     1. Vaginal atrophy Stable Continue with Estrace vaginal cream        I discussed the assessment and treatment plan with the patient. The patient was provided an opportunity to ask questions and all were answered. The patient agreed with the plan and demonstrated an understanding of the instructions.   The patient was advised to call back or seek an in-person evaluation/go to the ED if the symptoms worsen or if the condition fails to improve as anticipated.  I provided 8 minutes of non-face-to-face time during this encounter.   Chancy Milroy, MD [facility], [place]

## 2019-07-01 ENCOUNTER — Other Ambulatory Visit: Payer: Self-pay

## 2019-07-01 DIAGNOSIS — Z20822 Contact with and (suspected) exposure to covid-19: Secondary | ICD-10-CM

## 2019-07-03 ENCOUNTER — Encounter: Payer: Self-pay | Admitting: Critical Care Medicine

## 2019-07-03 ENCOUNTER — Telehealth: Payer: Self-pay | Admitting: Critical Care Medicine

## 2019-07-03 DIAGNOSIS — I10 Essential (primary) hypertension: Secondary | ICD-10-CM | POA: Insufficient documentation

## 2019-07-03 DIAGNOSIS — R32 Unspecified urinary incontinence: Secondary | ICD-10-CM | POA: Insufficient documentation

## 2019-07-03 LAB — NOVEL CORONAVIRUS, NAA: SARS-CoV-2, NAA: DETECTED — AB

## 2019-07-03 NOTE — Telephone Encounter (Signed)
This patient is positive for Covid I did attempt to call her but did not receive an answer I did leave a voicemail for her to call the clinic back so I can speak to her about her positive Covid test

## 2019-07-04 ENCOUNTER — Telehealth: Payer: Self-pay | Admitting: Unknown Physician Specialty

## 2019-07-04 NOTE — Telephone Encounter (Signed)
Unable to reach pt

## 2019-07-04 NOTE — Telephone Encounter (Signed)
Thank you Dr. Joya Gaskins,  I will see if Bryson Dames can get in touch with her as well.

## 2019-07-09 NOTE — Telephone Encounter (Signed)
Spoke to patient and inform on COVID results. Pt. Understood.  Spanish interpreter pacific assist with the call.

## 2019-07-10 DIAGNOSIS — K317 Polyp of stomach and duodenum: Secondary | ICD-10-CM

## 2019-07-30 ENCOUNTER — Ambulatory Visit (HOSPITAL_COMMUNITY): Payer: Self-pay

## 2019-07-30 ENCOUNTER — Ambulatory Visit
Admission: RE | Admit: 2019-07-30 | Discharge: 2019-07-30 | Disposition: A | Discharge status: Home / Self Care | Payer: No Typology Code available for payment source | Source: Ambulatory Visit | Attending: Obstetrics and Gynecology | Admitting: Obstetrics and Gynecology

## 2019-07-30 ENCOUNTER — Other Ambulatory Visit: Payer: Self-pay

## 2019-07-30 ENCOUNTER — Encounter (HOSPITAL_COMMUNITY): Payer: Self-pay

## 2019-07-30 DIAGNOSIS — Z1231 Encounter for screening mammogram for malignant neoplasm of breast: Secondary | ICD-10-CM

## 2020-12-03 ENCOUNTER — Emergency Department (HOSPITAL_COMMUNITY)
Admission: EM | Admit: 2020-12-03 | Discharge: 2020-12-03 | Disposition: A | Discharge status: Home / Self Care | Payer: 59 | Attending: Emergency Medicine | Admitting: Emergency Medicine

## 2020-12-03 ENCOUNTER — Encounter (HOSPITAL_COMMUNITY): Payer: Self-pay

## 2020-12-03 ENCOUNTER — Other Ambulatory Visit: Payer: Self-pay

## 2020-12-03 DIAGNOSIS — I10 Essential (primary) hypertension: Secondary | ICD-10-CM | POA: Insufficient documentation

## 2020-12-03 DIAGNOSIS — R519 Headache, unspecified: Secondary | ICD-10-CM | POA: Diagnosis present

## 2020-12-03 DIAGNOSIS — E782 Mixed hyperlipidemia: Secondary | ICD-10-CM

## 2020-12-03 DIAGNOSIS — Z79899 Other long term (current) drug therapy: Secondary | ICD-10-CM | POA: Diagnosis not present

## 2020-12-03 DIAGNOSIS — R42 Dizziness and giddiness: Secondary | ICD-10-CM | POA: Diagnosis not present

## 2020-12-03 LAB — CBC
HCT: 44.5 % (ref 36.0–46.0)
Hemoglobin: 14.7 g/dL (ref 12.0–15.0)
MCH: 30 pg (ref 26.0–34.0)
MCHC: 33 g/dL (ref 30.0–36.0)
MCV: 90.8 fL (ref 80.0–100.0)
Platelets: 242 10*3/uL (ref 150–400)
RBC: 4.9 MIL/uL (ref 3.87–5.11)
RDW: 13.5 % (ref 11.5–15.5)
WBC: 9.2 10*3/uL (ref 4.0–10.5)
nRBC: 0 % (ref 0.0–0.2)

## 2020-12-03 LAB — BASIC METABOLIC PANEL
Anion gap: 7 (ref 5–15)
BUN: 17 mg/dL (ref 8–23)
CO2: 24 mmol/L (ref 22–32)
Calcium: 9.3 mg/dL (ref 8.9–10.3)
Chloride: 108 mmol/L (ref 98–111)
Creatinine, Ser: 0.62 mg/dL (ref 0.44–1.00)
GFR, Estimated: >60 mL/min (ref >60)
Glucose, Bld: 140 mg/dL — ABNORMAL HIGH (ref 70–99)
Potassium: 4.1 mmol/L (ref 3.5–5.1)
Sodium: 139 mmol/L (ref 135–145)

## 2020-12-03 MED ORDER — AMLODIPINE BESYLATE 5 MG PO TABS
5.0000 mg | ORAL_TABLET | Freq: Once | ORAL | Status: AC
  Administered 2020-12-03: 5 mg via ORAL
  Filled 2020-12-03: qty 1

## 2020-12-03 MED ORDER — ATORVASTATIN CALCIUM 10 MG PO TABS
10.0000 mg | ORAL_TABLET | Freq: Every day | ORAL | 3 refills | Status: DC
  Filled 2020-12-03: qty 30, 30d supply, fill #0
  Filled 2021-03-01: qty 90, 90d supply, fill #1

## 2020-12-03 MED ORDER — AMLODIPINE BESYLATE 5 MG PO TABS
5.0000 mg | ORAL_TABLET | Freq: Every day | ORAL | 3 refills | Status: DC
Start: 2020-12-03 — End: 2021-05-10
  Filled 2020-12-03: qty 30, 30d supply, fill #0
  Filled 2021-01-02: qty 30, 30d supply, fill #1
  Filled 2021-02-03: qty 30, 30d supply, fill #2
  Filled 2021-03-01: qty 90, 90d supply, fill #3

## 2020-12-03 NOTE — ED Provider Notes (Signed)
[facility] EMERGENCY DEPARTMENT Provider Note   CSN: [id number] Arrival date & time: 12/03/20  0058     History Chief Complaint  Patient presents with  . Headache  . Dizziness  . Hypertension    Sandra Gardner is a 65 y.o. female.  Patient presents to the emergency department with concerns of elevated blood pressure.  Patient has a history of hypertension.  She has not been taking her Norvasc or atorvastatin for the last couple of months.  Today she noticed dizziness, headache and her blood pressure was very elevated.  Patient given a dose of blood pressure medicine in triage, blood pressure is now improved.  She no longer has a headache or any dizziness.        Past Medical History:  Diagnosis Date  . Cystocele   . Hypertension   . Urinary incontinence   . Vaginal atrophy     Patient Active Problem List   Diagnosis Date Noted  . Hypertension   . Urinary incontinence   . Vaginal atrophy 09/04/2018  . Special screening for malignant neoplasms, colon     Past Surgical History:  Procedure Laterality Date  . CESAREAN SECTION     3 previous  . CHOLECYSTECTOMY    . COLONOSCOPY N/A 10/18/2016   Procedure: COLONOSCOPY;  Surgeon: West Bali, MD;  [facility];  Service: Endoscopy;  Laterality: N/A;  1230   . ENDOMETRIAL ABLATION  2012  . SHOULDER SURGERY Right   . TUBAL LIGATION     BTL-  12-7122     OB History    Gravida  4   Para  3   Term      Preterm      AB  1   Living  3     SAB      IAB  1   Ectopic      Multiple      Live Births              Family History  Problem Relation Age of Onset  . Diabetes Father   . Hypertension Paternal Grandmother   . Thyroid disease Sister     Social History   Tobacco Use  . Smoking status: Never Smoker  . Smokeless tobacco: Never Used  Vaping Use  . Vaping Use: Never used  Substance Use Topics  . Alcohol use: No  . Drug use: No    Home  Medications Prior to Admission medications   Medication Sig Start Date End Date Taking? Authorizing Provider  Cholecalciferol (VITAMIN D3 PO) Take 1 tablet by mouth daily.   Yes [provider]  loratadine (CLARITIN) 10 MG tablet Take 10 mg by mouth daily as needed for allergies.   Yes [provider]  Omega 3 1000 MG CAPS Take 1,000 mg by mouth daily.   Yes [provider]  vitamin B-12 (CYANOCOBALAMIN) 1000 MCG tablet Take 1,000 mcg by mouth daily.   Yes [provider]  amLODipine (NORVASC) 5 MG tablet Take 1 tablet (5 mg total) by mouth daily. 12/03/20   Gilda Crease, MD  atorvastatin (LIPITOR) 10 MG tablet Take 1 tablet (10 mg total) by mouth daily. 12/03/20   Gilda Crease, MD    Allergies    Apple, Valentino Saxon, and Citrus aurantium [citrus]  Review of Systems   Review of Systems  Respiratory: Negative for shortness of breath.   Cardiovascular: Negative for chest pain, palpitations and leg swelling.  Neurological: Positive for dizziness and headaches.  All other systems reviewed and are negative.   Physical Exam Updated Vital Signs BP (!) 176/71   Pulse 66   Temp 98.5 F (36.9 C) (Oral)   Resp 18   Ht 4\' 9"  (1.448 m)   Wt 53.5 kg   SpO2 99%   BMI 25.52 kg/m   Physical Exam Vitals and nursing note reviewed.  Constitutional:      General: She is not in acute distress.    Appearance: Normal appearance. She is well-developed.  HENT:     Head: Normocephalic and atraumatic.     Right Ear: Hearing normal.     Left Ear: Hearing normal.     Nose: Nose normal.  Eyes:     Conjunctiva/sclera: Conjunctivae normal.     Pupils: Pupils are equal, round, and reactive to light.  Cardiovascular:     Rate and Rhythm: Regular rhythm.     Heart sounds: S1 normal and S2 normal. No murmur heard. No friction rub. No gallop.   Pulmonary:     Effort: Pulmonary effort is normal. No respiratory distress.     Breath sounds: Normal breath  sounds.  Chest:     Chest wall: No tenderness.  Abdominal:     General: Bowel sounds are normal.     Palpations: Abdomen is soft.     Tenderness: There is no abdominal tenderness. There is no guarding or rebound. Negative signs include Murphy's sign and McBurney's sign.     Hernia: No hernia is present.  Musculoskeletal:        General: Normal range of motion.     Cervical back: Normal range of motion and neck supple.  Skin:    General: Skin is warm and dry.     Findings: No rash.  Neurological:     Mental Status: She is alert and oriented to person, place, and time.     GCS: GCS eye subscore is 4. GCS verbal subscore is 5. GCS motor subscore is 6.     Cranial Nerves: No cranial nerve deficit.     Sensory: No sensory deficit.     Coordination: Coordination normal.  Psychiatric:        Speech: Speech normal.        Behavior: Behavior normal.        Thought Content: Thought content normal.     ED Results / Procedures / Treatments   Labs (all labs ordered are listed, but only abnormal results are displayed) Labs Reviewed  BASIC METABOLIC PANEL - Abnormal; Notable for the following components:      Result Value   Glucose, Bld 140 (*)    All other components within normal limits  CBC    EKG None  Radiology No results found.  Procedures Procedures   Medications Ordered in ED Medications  amLODipine (NORVASC) tablet 5 mg (5 mg Oral Given 12/03/20 0129)  amLODipine (NORVASC) tablet 5 mg (5 mg Oral Given 12/03/20 12/05/20)    ED Course  I have reviewed the triage vital signs and the nursing notes.  Pertinent labs & imaging results that were available during my care of the patient were reviewed by me and considered in my medical decision making (see chart for details).    MDM Rules/Calculators/A&P                          Patient presents to the emergency department for evaluation of dizziness and  headache associated with high blood pressure.  She was off of her  medications for a couple of months.  She was given Norvasc 5 mg at time of medical screening exam and blood pressure improved.  Headache and dizziness completely resolved.  Blood pressure was still elevated so she was given a second dose of Norvasc and labs were checked.  Labs are unremarkable.  Blood pressure further improved.  Will discharge her with a prescription for her Norvasc and atorvastatin, recommend follow-up in 1 or 2 weeks for a recheck.  She might need a higher dose of Norvasc but will start at her previous dose as she reports her blood pressure was well controlled.  Final Clinical Impression(s) / ED Diagnoses Final diagnoses:  Primary hypertension    Rx / DC Orders ED Discharge Orders         Ordered    amLODipine (NORVASC) 5 MG tablet  Daily        12/03/20 0611    atorvastatin (LIPITOR) 10 MG tablet  Daily        12/03/20 0611           Gilda Crease, MD 12/03/20 [phone]

## 2020-12-03 NOTE — ED Notes (Signed)
Patient states she hasn't taken her b/p meds in 2-3 months states she has been out of them.

## 2020-12-03 NOTE — ED Provider Notes (Signed)
MSE was initiated and I personally evaluated the patient and placed orders (if any) at  1:12 AM on Dec 03, 2020.  Presents with mild dizziness since yesterday, and feeling anxious, "agitated". Denies chest pain, nausea. Concerned about blood pressure but hasn't taken any medication in 6 months.   RRR Lungs clear Ambulatory with steady gait  The patient appears stable so that the remainder of the MSE may be completed by another provider.   Elpidio Anis, PA-C 12/03/20 0113    Gilda Crease, MD 12/03/20 [phone]

## 2020-12-03 NOTE — ED Triage Notes (Signed)
Patient reports "agitated, dizziness, headache" starting yesterday, feels like the room is spinning, denies chest pain, shortness of breath, nausea and vomiting, mild headache

## 2020-12-05 ENCOUNTER — Other Ambulatory Visit: Payer: Self-pay

## 2020-12-15 ENCOUNTER — Other Ambulatory Visit: Payer: Self-pay

## 2020-12-15 DIAGNOSIS — Z1231 Encounter for screening mammogram for malignant neoplasm of breast: Secondary | ICD-10-CM

## 2020-12-23 ENCOUNTER — Ambulatory Visit: Payer: No Typology Code available for payment source | Admitting: Nurse Practitioner

## 2021-01-02 ENCOUNTER — Other Ambulatory Visit: Payer: Self-pay

## 2021-01-12 ENCOUNTER — Other Ambulatory Visit: Payer: Self-pay

## 2021-01-12 ENCOUNTER — Encounter: Payer: Self-pay | Admitting: Nurse Practitioner

## 2021-01-12 ENCOUNTER — Ambulatory Visit (INDEPENDENT_AMBULATORY_CARE_PROVIDER_SITE_OTHER): Payer: 59 | Admitting: Nurse Practitioner

## 2021-01-12 VITALS — Ht <= 58 in | Wt 113.0 lb

## 2021-01-12 DIAGNOSIS — I10 Essential (primary) hypertension: Secondary | ICD-10-CM

## 2021-01-12 DIAGNOSIS — R7309 Other abnormal glucose: Secondary | ICD-10-CM

## 2021-01-13 NOTE — Progress Notes (Signed)
   [facility] Patient [facility] [address] [place], [place]  [address] Phone:  [phone]   Fax:  [phone]  Patient has PCP

## 2021-01-26 ENCOUNTER — Ambulatory Visit: Payer: No Typology Code available for payment source

## 2021-02-03 ENCOUNTER — Other Ambulatory Visit: Payer: Self-pay

## 2021-02-03 ENCOUNTER — Ambulatory Visit: Payer: No Typology Code available for payment source | Admitting: Nurse Practitioner

## 2021-02-07 ENCOUNTER — Ambulatory Visit
Admission: RE | Admit: 2021-02-07 | Discharge: 2021-02-07 | Disposition: A | Discharge status: Home / Self Care | Payer: No Typology Code available for payment source | Source: Ambulatory Visit | Attending: Obstetrics and Gynecology | Admitting: Obstetrics and Gynecology

## 2021-02-07 ENCOUNTER — Ambulatory Visit: Payer: Self-pay | Admitting: *Deleted

## 2021-02-07 ENCOUNTER — Other Ambulatory Visit: Payer: Self-pay

## 2021-02-07 VITALS — BP 132/88 | Wt 114.5 lb

## 2021-02-07 DIAGNOSIS — Z1239 Encounter for other screening for malignant neoplasm of breast: Secondary | ICD-10-CM

## 2021-02-07 NOTE — Patient Instructions (Addendum)
Explained breast self awareness with Sandra Gardner. Patient did not need a Pap smear today due to last Pap smear was 04/09/2018. Let her know [facility] will cover Pap smears every 3 years unless has a history of abnormal Pap smears. Patient scheduled for a Pap smear at the free cervical cancer screening on Thursday, April 13, 2021 at 0830. Referred patient to the [facility] for a screening mammogram on the mobile unit. Appointment scheduled Tuesday, February 07, 2021 at 1000. Patient aware of Pap smear appointment and will be there. Patient escorted to the mobile unit following [facility] appointment for her screening mammogram. Let patient know the Breast Center will follow up with her within the next couple weeks with results of her mammogram by letter or phone. Sandra Gardner verbalized understanding.  Braiden Rodman, Kathaleen Maser, RN 9:01 AM

## 2021-02-07 NOTE — Progress Notes (Signed)
Ms. Donnisha Besecker Gwen Pounds is a 65 y.o. female who presents to [facility] clinic today with no complaints.    Pap Smear: Pap smear not completed today. Last Pap smear was 04/09/2018 at [facility] and was normal. Per patient has no history of an abnormal Pap smear. Last Pap smear result is available in Epic.   Physical exam: Breasts Breasts symmetrical. No skin abnormalities bilateral breasts. No nipple retraction bilateral breasts. No nipple discharge bilateral breasts. No lymphadenopathy. No lumps palpated bilateral breasts. No complaints of pain or tenderness on exam.  MS DIGITAL SCREENING BILATERAL  Result Date: 07/12/2016 CLINICAL DATA:  Screening. EXAM: DIGITAL SCREENING BILATERAL MAMMOGRAM WITH CAD COMPARISON:  Previous exam(s). ACR Breast Density Category b: There are scattered areas of fibroglandular density. FINDINGS: In the right breast, a possible mass warrants further evaluation. In the left breast, no findings suspicious for malignancy. Images were processed with CAD. IMPRESSION: Further evaluation is suggested for possible mass in the right breast. RECOMMENDATION: Diagnostic mammogram and possibly ultrasound of the right breast. (Code:FI-R-30M) The patient will be contacted regarding the findings, and additional imaging will be scheduled. BI-RADS CATEGORY  0: Incomplete. Need additional imaging evaluation and/or prior mammograms for comparison. Electronically Signed   By: Sherian Rein M.D.   On: 07/18/2016 08:47   MM DIAG BREAST TOMO UNI RIGHT  Result Date: 07/30/2016 CLINICAL DATA:  Screening recall for a right breast mass. EXAM: 2D DIGITAL DIAGNOSTIC UNILATERAL RIGHT MAMMOGRAM WITH CAD AND ADJUNCT TOMO RIGHT BREAST ULTRASOUND COMPARISON:  Previous exam(s). ACR Breast Density Category b: There are scattered areas of fibroglandular density. FINDINGS: Though the mass identified on the screening mammogram does appear less prominent on the tomosynthesis  images, it does persist. Mammographic images were processed with CAD. Ultrasound of the medial right breast demonstrates a circumscribed anechoic oval mass measuring 5 x 1 x 3 mm, consistent with a benign cyst. IMPRESSION: The mass of concern identified on the screening mammogram corresponds with a benign cyst. RECOMMENDATION: Screening mammogram in one year.(Code:SM-B-01Y) I have discussed the findings and recommendations with the patient. Results were also provided in writing at the conclusion of the visit. If applicable, a reminder letter will be sent to the patient regarding the next appointment. BI-RADS CATEGORY  2: Benign. Electronically Signed   By: Frederico Hamman M.D.   On: 07/30/2016 15:20   MS DIGITAL SCREENING TOMO BILATERAL  Result Date: 08/03/2019 CLINICAL DATA:  Screening. EXAM: DIGITAL SCREENING BILATERAL MAMMOGRAM WITH TOMO AND CAD COMPARISON:  Previous exam(s). ACR Breast Density Category b: There are scattered areas of fibroglandular density. FINDINGS: There are no findings suspicious for malignancy. Images were processed with CAD. IMPRESSION: No mammographic evidence of malignancy. A result letter of this screening mammogram will be mailed directly to the patient. RECOMMENDATION: Screening mammogram in one year. (Code:SM-B-01Y) BI-RADS CATEGORY  1: Negative. Electronically Signed   By: Sande Brothers M.D.   On: 08/03/2019 08:00   MS DIGITAL SCREENING TOMO BILATERAL  Result Date: 05/29/2018 CLINICAL DATA:  Screening. EXAM: DIGITAL SCREENING BILATERAL MAMMOGRAM WITH TOMO AND CAD COMPARISON:  Previous exam(s). ACR Breast Density Category b: There are scattered areas of fibroglandular density. FINDINGS: There are no findings suspicious for malignancy. Images were processed with CAD. IMPRESSION: No mammographic evidence of malignancy. A result letter of this screening mammogram will be mailed directly to the patient. RECOMMENDATION: Screening mammogram in one year. (Code:SM-B-01Y) BI-RADS  CATEGORY  1: Negative. Electronically Signed   By: Sherian Rein  M.D.   On: 05/29/2018 16:39        Pelvic/Bimanual Pap is not indicated today per BCCCP guidelines.   Smoking History: Patient has never smoked.   Patient Navigation: Patient education provided. Access to services provided for patient through [place] program. Spanish interpreter Natale Lay from [facility] provided.   Colorectal Cancer Screening: Per patient has had colonoscopy completed on 10/18/2016 at [facility]. FIT Test completed 06/04/2018 that was negative.  No complaints today.    Breast and Cervical Cancer Risk Assessment: Patient does not have family history of breast cancer, known genetic mutations, or radiation treatment to the chest before age 89. Patient does not have history of cervical dysplasia, immunocompromised, or DES exposure in-utero.  Risk Assessment     Risk Scores       02/07/2021 05/29/2018   Last edited by: Meryl Dare, CMA Stoney Bang H, LPN   5-year risk: 1 % 0.9 %   Lifetime risk: 4.4 % 4.9 %            A: BCCCP exam without pap smear No complaints.  P: Referred patient to the [facility] for a screening mammogram on the mobile unit. Appointment scheduled Tuesday, February 07, 2021 at 1000.  Priscille Heidelberg, RN 02/07/2021 9:01 AM

## 2021-02-08 ENCOUNTER — Encounter: Payer: Self-pay | Admitting: Nurse Practitioner

## 2021-02-08 ENCOUNTER — Ambulatory Visit: Payer: 59 | Attending: Nurse Practitioner | Admitting: Nurse Practitioner

## 2021-02-08 DIAGNOSIS — R7303 Prediabetes: Secondary | ICD-10-CM | POA: Diagnosis not present

## 2021-02-08 DIAGNOSIS — E785 Hyperlipidemia, unspecified: Secondary | ICD-10-CM | POA: Diagnosis not present

## 2021-02-08 DIAGNOSIS — Z1159 Encounter for screening for other viral diseases: Secondary | ICD-10-CM

## 2021-02-08 DIAGNOSIS — I10 Essential (primary) hypertension: Secondary | ICD-10-CM | POA: Diagnosis not present

## 2021-02-08 DIAGNOSIS — Z114 Encounter for screening for human immunodeficiency virus [HIV]: Secondary | ICD-10-CM

## 2021-02-08 NOTE — Progress Notes (Signed)
Virtual Visit via Telephone Note Due to national recommendations of social distancing due to COVID 19, telehealth visit is felt to be most appropriate for this patient at this time.  I discussed the limitations, risks, security and privacy concerns of performing an evaluation and management service by telephone and the availability of in person appointments. I also discussed with the patient that there may be a patient responsible charge related to this service. The patient expressed understanding and agreed to proceed.    I connected with Joan Flores on 02/08/21  at  11:10 AM EDT  EDT by telephone and verified that I am speaking with the correct person using two identifiers.  Location of Patient: Private Residence   Location of Provider: [facility]    Persons participating in Telemedicine visit: Bertram Denver FNP-BC Nila Nephew Gwen Pounds    History of Present Illness: Telemedicine visit for: HTN She has a past medical history of Cystocele, Hypertension, Urinary incontinence, and Vaginal atrophy.   Essential Hypertension Well controlled. Taking amlodipine 5 mg daily as prescribed. Denies chest pain, shortness of breath, palpitations, lightheadedness, dizziness, headaches or BLE edema.   BP Readings from Last 3 Encounters:  02/07/21 132/88  12/03/20 (!) 151/70  02/04/19 121/83    Prediabetes Well controlled with diet only at this time. LDL at goal with atorvastatin 10 mg daily.  Lab Results  Component Value Date   HGBA1C 6.4 (H) 03/18/2018    Lab Results  Component Value Date   LDLCALC 61 02/04/2019    Past Medical History:  Diagnosis Date   Cystocele    Hypertension    Urinary incontinence    Vaginal atrophy     Past Surgical History:  Procedure Laterality Date   CESAREAN SECTION     3 previous   CHOLECYSTECTOMY     COLONOSCOPY N/A 10/18/2016   Procedure: COLONOSCOPY;  Surgeon: West Bali, MD;  Location: [facility];  Service: Endoscopy;  Laterality: N/A;  1230    ENDOMETRIAL ABLATION  2012   SHOULDER SURGERY Right    TUBAL LIGATION     BTL-  [id number]    Family History  Problem Relation Age of Onset   Diabetes Father    Thyroid disease Sister    Hypertension Paternal Grandmother    Breast cancer Neg Hx     Social History   Socioeconomic History   Marital status: Married    Spouse name: Not on file   Number of children: Not on file   Years of education: Not on file   Highest education level: Not on file  Occupational History   Not on file  Tobacco Use   Smoking status: Never   Smokeless tobacco: Never  Vaping Use   Vaping Use: Never used  Substance and Sexual Activity   Alcohol use: No   Drug use: No   Sexual activity: Yes    Birth control/protection: Post-menopausal, Surgical  Other Topics Concern   Not on file  Social History Narrative   Not on file   Social Determinants of Health   Financial Resource Strain: Not on file  Food Insecurity: No Food Insecurity   Worried About Running Out of Food in the Last Year: Never true   Ran Out of Food in the Last Year: Never true  Transportation Needs: No Transportation Needs   Lack of Transportation (Medical): No   Lack of Transportation (Non-Medical): No  Physical Activity: Not on file  Stress: Not  on file  Social Connections: Not on file     Observations/Objective: Awake, alert and oriented x 3   Review of Systems  Constitutional:  Negative for fever, malaise/fatigue and weight loss.  HENT: Negative.  Negative for nosebleeds.   Eyes: Negative.  Negative for blurred vision, double vision and photophobia.  Respiratory: Negative.  Negative for cough and shortness of breath.   Cardiovascular: Negative.  Negative for chest pain, palpitations and leg swelling.  Gastrointestinal: Negative.  Negative for heartburn, nausea and vomiting.  Musculoskeletal: Negative.  Negative for myalgias.  Neurological: Negative.  Negative  for dizziness, focal weakness, seizures and headaches.  Psychiatric/Behavioral: Negative.  Negative for suicidal ideas.    Assessment and Plan: Diagnoses and all orders for this visit:  Essential hypertension  Prediabetes -     Hemoglobin A1c; Future  Dyslipidemia, goal LDL below 100 -     Lipid panel; Future  Encounter for screening for HIV -     HIV antibody (with reflex); Future  Need for hepatitis C screening test -     HCV Ab w Reflex to Quant PCR; Future    Follow Up Instructions Return in about 3 months (around 05/11/2021).     I discussed the assessment and treatment plan with the patient. The patient was provided an opportunity to ask questions and all were answered. The patient agreed with the plan and demonstrated an understanding of the instructions.   The patient was advised to call back or seek an in-person evaluation if the symptoms worsen or if the condition fails to improve as anticipated.  I provided 10 minutes of non-face-to-face time during this encounter including median intraservice time, reviewing previous notes, labs, imaging, medications and explaining diagnosis and management.  Claiborne Rigg, FNP-BC

## 2021-03-01 ENCOUNTER — Other Ambulatory Visit: Payer: Self-pay

## 2021-03-02 ENCOUNTER — Ambulatory Visit: Payer: No Typology Code available for payment source

## 2021-03-02 ENCOUNTER — Other Ambulatory Visit: Payer: Self-pay

## 2021-03-02 DIAGNOSIS — R7303 Prediabetes: Secondary | ICD-10-CM

## 2021-03-02 DIAGNOSIS — E785 Hyperlipidemia, unspecified: Secondary | ICD-10-CM

## 2021-03-02 DIAGNOSIS — Z1159 Encounter for screening for other viral diseases: Secondary | ICD-10-CM

## 2021-03-02 DIAGNOSIS — Z114 Encounter for screening for human immunodeficiency virus [HIV]: Secondary | ICD-10-CM

## 2021-03-03 LAB — LIPID PANEL
Chol/HDL Ratio: 2.5 ratio (ref 0.0–4.4)
Cholesterol, Total: 145 mg/dL (ref 100–199)
HDL: 59 mg/dL (ref >39)
LDL Chol Calc (NIH): 68 mg/dL (ref 0–99)
Triglycerides: 95 mg/dL (ref 0–149)
VLDL Cholesterol Cal: 18 mg/dL (ref 5–40)

## 2021-03-03 LAB — HCV AB W REFLEX TO QUANT PCR: HCV Ab: <0.1 s/co ratio (ref 0.0–0.9)

## 2021-03-03 LAB — HEMOGLOBIN A1C
Est. average glucose Bld gHb Est-mCnc: 117 mg/dL
Hgb A1c MFr Bld: 5.7 % — ABNORMAL HIGH (ref 4.8–5.6)

## 2021-03-03 LAB — HIV ANTIBODY (ROUTINE TESTING W REFLEX): HIV Screen 4th Generation wRfx: NONREACTIVE

## 2021-03-03 LAB — HCV INTERPRETATION

## 2021-04-13 ENCOUNTER — Other Ambulatory Visit: Payer: Self-pay

## 2021-04-13 ENCOUNTER — Other Ambulatory Visit: Payer: Self-pay | Admitting: *Deleted

## 2021-04-13 DIAGNOSIS — Z124 Encounter for screening for malignant neoplasm of cervix: Secondary | ICD-10-CM

## 2021-04-13 NOTE — Addendum Note (Signed)
Addended by: Narda Rutherford on: 04/13/2021 09:00 AM   Modules accepted: Orders

## 2021-04-13 NOTE — Progress Notes (Signed)
Patient: Sandra Gardner           Date of Birth: 1956/04/17           MRN: [id number] Visit Date: 04/13/2021 PCP: Claiborne Rigg, NP  Cervical Cancer Screening Do you smoke?: No Have you ever had or been told you have an allergy to latex products?: No Marital status: Married Date of last pap smear: 2-5 yrs ago (04/09/2018-negative ([facility])) Date of last menstrual period:  (postmenopausal) Number of pregnancies: 4 Number of births: 3 Have you ever had any of the following? Hysterectomy: No Tubal ligation (tubes tied): No Abnormal bleeding: No Abnormal pap smear: No Venereal warts: No A sex partner with venereal warts: No A high risk* sex partner: No  Cervical Exam  Abnormal Observations: Normal Exam. Recommendations: Last Pap smear was 04/09/2018 at [facility] and was normal. Per patient has no history of an abnormal Pap smear. Last Pap smear result is available in Epic. Let patient know if today's Pap smear is normal and HPV negative that her next Pap smear will be due in 5 years. Informed patient that we will follow-up with her within the next couple of weeks with results of her Pap smear by phone.    Used Spanish interpreter [employer] from [place].  Patient's History Patient Active Problem List   Diagnosis Date Noted   Hypertension    Urinary incontinence    Vaginal atrophy 09/04/2018   Special screening for malignant neoplasms, colon    Past Medical History:  Diagnosis Date   Cystocele    Hypertension    Urinary incontinence    Vaginal atrophy     Family History  Problem Relation Age of Onset   Diabetes Father    Thyroid disease Sister    Hypertension Paternal Grandmother    Breast cancer Neg Hx     Social History   Occupational History   Not on file  Tobacco Use   Smoking status: Never   Smokeless tobacco: Never  Vaping Use   Vaping Use: Never used  Substance and Sexual Activity   Alcohol  use: No   Drug use: No   Sexual activity: Yes    Birth control/protection: Post-menopausal, Surgical

## 2021-04-17 LAB — CYTOLOGY - PAP
Comment: NEGATIVE
Diagnosis: NEGATIVE
Diagnosis: REACTIVE
High risk HPV: NEGATIVE

## 2021-05-04 NOTE — Progress Notes (Signed)
[facility] results. Informed patient that pap was normal and HPV was negative. Patient has no hx of abnormal pap. Informed patient that she may or may not need another pap due to her age and hx of no abnormals. Informed patient that it would be up to her PCP to decide if she would need another one in the future. Patient voiced understanding.

## 2021-05-10 ENCOUNTER — Other Ambulatory Visit: Payer: Self-pay

## 2021-05-10 ENCOUNTER — Encounter: Payer: Self-pay | Admitting: Nurse Practitioner

## 2021-05-10 ENCOUNTER — Ambulatory Visit: Payer: 59 | Attending: Nurse Practitioner | Admitting: Nurse Practitioner

## 2021-05-10 VITALS — BP 142/82 | HR 68 | Ht <= 58 in | Wt 118.4 lb

## 2021-05-10 DIAGNOSIS — E782 Mixed hyperlipidemia: Secondary | ICD-10-CM | POA: Diagnosis not present

## 2021-05-10 DIAGNOSIS — I10 Essential (primary) hypertension: Secondary | ICD-10-CM | POA: Diagnosis not present

## 2021-05-10 MED ORDER — ATORVASTATIN CALCIUM 10 MG PO TABS
10.0000 mg | ORAL_TABLET | Freq: Every day | ORAL | 3 refills | Status: DC
  Filled 2021-05-10: qty 90, 90d supply, fill #0
  Filled 2021-06-02: qty 30, 30d supply, fill #1
  Filled 2021-10-11: qty 30, 30d supply, fill #0

## 2021-05-10 MED ORDER — AMLODIPINE BESYLATE 10 MG PO TABS
10.0000 mg | ORAL_TABLET | Freq: Every day | ORAL | 1 refills | Status: DC
  Filled 2021-05-10: qty 90, 90d supply, fill #0
  Filled 2021-06-02: qty 30, 30d supply, fill #1
  Filled 2021-09-22: qty 30, 30d supply, fill #0

## 2021-05-10 NOTE — Progress Notes (Signed)
Assessment & Plan:  Sandra Gardner was seen today for hypertension.  Diagnoses and all orders for this visit:  Primary hypertension -     amLODipine (NORVASC) 10 MG tablet; Take 1 tablet (10 mg total) by mouth daily. Please fill as a 90 day supply -     Basic metabolic panel Continue all antihypertensives as prescribed.  Remember to bring in your blood pressure log with you for your follow up appointment.  DASH/Mediterranean Diets are healthier choices for HTN.    Mixed hyperlipidemia -     atorvastatin (LIPITOR) 10 MG tablet; Take 1 tablet (10 mg total) by mouth daily. Please fill as a 90 day supply INSTRUCTIONS: Work on a low fat, heart healthy diet and participate in regular aerobic exercise program by working out at least 150 minutes per week; 5 days a week-30 minutes per day. Avoid red meat/beef/steak,  fried foods. junk foods, sodas, sugary drinks, unhealthy snacking, alcohol and smoking.  Drink at least 80 oz of water per day and monitor your carbohydrate intake daily.    Patient has been counseled on age-appropriate routine health concerns for screening and prevention. These are reviewed and up-to-date. Referrals have been placed accordingly. Immunizations are up-to-date or declined.    Subjective:   Chief Complaint  Patient presents with   Hypertension   Hypertension Pertinent negatives include no blurred vision, chest pain, headaches, malaise/fatigue, palpitations or shortness of breath.  Sandra Gardner 65 y.o. female presents to office today for follow up to HTN. She has a past medical history of Cystocele, Hypertension, Urinary incontinence, and Vaginal atrophy.    VRI was used to communicate directly with patient for the entire encounter including providing detailed patient instructions.    HTN Not well controlled. Will increase amlodipine to 10 mg from 5 mg today. Denies chest pain, shortness of breath, palpitations, lightheadedness, dizziness, headaches or BLE  edema.   BP Readings from Last 3 Encounters:  05/10/21 (!) 142/82  02/07/21 132/88  12/03/20 (!) 151/70    Dyslipidemia LDL at goal with atorvastatin 10 mg daily.  Lab Results  Component Value Date   LDLCALC 68 03/02/2021    Review of Systems  Constitutional:  Negative for fever, malaise/fatigue and weight loss.  HENT: Negative.  Negative for nosebleeds.   Eyes: Negative.  Negative for blurred vision, double vision and photophobia.  Respiratory: Negative.  Negative for cough and shortness of breath.   Cardiovascular: Negative.  Negative for chest pain, palpitations and leg swelling.  Gastrointestinal: Negative.  Negative for heartburn, nausea and vomiting.  Musculoskeletal: Negative.  Negative for myalgias.  Neurological: Negative.  Negative for dizziness, focal weakness, seizures and headaches.  Psychiatric/Behavioral: Negative.  Negative for suicidal ideas.    Past Medical History:  Diagnosis Date   Cystocele    Hypertension    Urinary incontinence    Vaginal atrophy     Past Surgical History:  Procedure Laterality Date   CESAREAN SECTION     3 previous   CHOLECYSTECTOMY     COLONOSCOPY N/A 10/18/2016   Procedure: COLONOSCOPY;  Surgeon: West Bali, MD;  [facility];  Service: Endoscopy;  Laterality: N/A;  1230    ENDOMETRIAL ABLATION  2012   SHOULDER SURGERY Right    TUBAL LIGATION     BTL-  [id number]    Family History  Problem Relation Age of Onset   Diabetes Father    Thyroid disease Sister    Hypertension Paternal Grandmother  Breast cancer Neg Hx     Social History Reviewed with no changes to be made today.   Outpatient Medications Prior to Visit  Medication Sig Dispense Refill   Cholecalciferol (VITAMIN D3 PO) Take 1 tablet by mouth daily.     loratadine (CLARITIN) 10 MG tablet Take 10 mg by mouth daily as needed for allergies.     Omega 3 1000 MG CAPS Take 1,000 mg by mouth daily.     vitamin B-12 (CYANOCOBALAMIN) 1000 MCG tablet Take  1,000 mcg by mouth daily.     amLODipine (NORVASC) 5 MG tablet Take 1 tablet (5 mg total) by mouth daily. 90 tablet 3   atorvastatin (LIPITOR) 10 MG tablet Take 1 tablet (10 mg total) by mouth daily. 90 tablet 3   No facility-administered medications prior to visit.    Allergies  Allergen Reactions   Apple Swelling   Cherry     swelling   Citrus Aurantium [Citrus]        Objective:    BP (!) 142/82   Pulse 68   Ht 4\' 9"  (1.448 m)   Wt 118 lb 6 oz (53.7 kg)   SpO2 100%   BMI 25.62 kg/m  Wt Readings from Last 3 Encounters:  05/10/21 118 lb 6 oz (53.7 kg)  02/07/21 114 lb 8 oz (51.9 kg)  01/12/21 113 lb 0.4 oz (51.3 kg)    Physical Exam Vitals and nursing note reviewed.  Constitutional:      Appearance: She is well-developed.  HENT:     Head: Normocephalic and atraumatic.  Cardiovascular:     Rate and Rhythm: Normal rate and regular rhythm.     Heart sounds: Normal heart sounds. No murmur heard.   No friction rub. No gallop.  Pulmonary:     Effort: Pulmonary effort is normal. No tachypnea or respiratory distress.     Breath sounds: Normal breath sounds. No decreased breath sounds, wheezing, rhonchi or rales.  Chest:     Chest wall: No tenderness.  Abdominal:     General: Bowel sounds are normal.     Palpations: Abdomen is soft.  Musculoskeletal:        General: Normal range of motion.     Cervical back: Normal range of motion.  Skin:    General: Skin is warm and dry.  Neurological:     Mental Status: She is alert and oriented to person, place, and time.     Coordination: Coordination normal.  Psychiatric:        Behavior: Behavior normal. Behavior is cooperative.        Thought Content: Thought content normal.        Judgment: Judgment normal.         Patient has been counseled extensively about nutrition and exercise as well as the importance of adherence with medications and regular follow-up. The patient was given clear instructions to go to ER or  return to medical center if symptoms don't improve, worsen or new problems develop. The patient verbalized understanding.   Follow-up: Return in about 2 weeks (around 05/24/2021) for BP CHECK WITH LUKE on November 9th.  See me in february .   March, FNP-BC [facility] [place], [place]   05/10/2021, 10:27 AM

## 2021-05-10 NOTE — Progress Notes (Signed)
De Nurse [id number]

## 2021-05-11 LAB — BASIC METABOLIC PANEL
BUN/Creatinine Ratio: 16 (ref 12–28)
BUN: 11 mg/dL (ref 8–27)
CO2: 25 mmol/L (ref 20–29)
Calcium: 9.4 mg/dL (ref 8.7–10.3)
Chloride: 102 mmol/L (ref 96–106)
Creatinine, Ser: 0.69 mg/dL (ref 0.57–1.00)
Glucose: 167 mg/dL — ABNORMAL HIGH (ref 70–99)
Potassium: 4.2 mmol/L (ref 3.5–5.2)
Sodium: 138 mmol/L (ref 134–144)
eGFR: 97 mL/min/{1.73_m2} (ref >59)

## 2021-05-23 NOTE — Progress Notes (Signed)
   S:    PCP: Sandra Gardner  Patient presents to the clinic for hypertension evaluation, counseling, and management. Patient was referred and last seen by Primary Care Provider on 05/10/21, at which time BP was 142/82 and amlodipine increased from 5 to 10 mg daily.   Today, patient arrives in good spirits. States she is taking amlodipine with no missed doses. Denies headaches, dizziness, swelling, chest pain, blurred vision.   Medication adherence reported with no missed doses.  Current BP Medications include: amlodipine 10 mg daily  Antihypertensives tried in the past include: none  Dietary habits include: cooks own food, eats salad, vegetables, little salt added to food Exercise habits include: walks all throughout the day, exercises 15-20 minutes at home (weights) Family / Social history:  -Tobacco: never smoker -Fhx: DM in father, thyroid disease in sister  O:  Home BP readings: does not check at home  Last 3 Office BP readings: BP Readings from Last 3 Encounters:  05/24/21 124/85  05/10/21 (!) 142/82  02/07/21 132/88    BMET    Component Value Date/Time   NA 138 05/10/2021 1034   K 4.2 05/10/2021 1034   CL 102 05/10/2021 1034   CO2 25 05/10/2021 1034   GLUCOSE 167 (H) 05/10/2021 1034   GLUCOSE 140 (H) 12/03/2020 0500   BUN 11 05/10/2021 1034   CREATININE 0.69 05/10/2021 1034   CREATININE 0.62 08/17/2016 1620   CALCIUM 9.4 05/10/2021 1034   GFRNONAA >60 12/03/2020 0500   GFRAA 117 02/04/2019 1557    Renal function: Estimated Creatinine Clearance: 50.4 mL/min (by C-G formula based on SCr of 0.69 mg/dL).  Clinical ASCVD: No  The 10-year ASCVD risk score (Arnett DK, et al., 2019) is: 5.1%   Values used to calculate the score:     Age: 65 years     Sex: Female     Is Non-Hispanic African American: No     Diabetic: No     Tobacco smoker: No     Systolic Blood Pressure: 124 mmHg     Is BP treated: Yes     HDL Cholesterol: 59 mg/dL     Total Cholesterol: 145  mg/dL  A/P:  Hypertension currently uncontrolled on current medications. BP Goal = <130/80 mmHg. Medication adherence appears appropriate.  -Continue amlodipine 10 mg daily -Counseled on lifestyle modifications for blood pressure control including reduced dietary sodium, increased exercise, adequate sleep. -Would like to follow up with patient one more time before visit with PCP planned for February to ensure BP still at goal and adherence is optimal, however patient is going to her home country in a couple weeks for a few months, so she will not be able to be seen again until next planned PCP visit. Encouraged her to ensure she has enough medications to last through this trip so that her BP remains at goal.   Total time in face-to-face counseling 20 minutes. F/U PCP Clinic Visit in February.     Pervis Hocking, PharmD PGY2 Ambulatory Care Pharmacy Resident 05/24/2021 12:32 PM

## 2021-05-24 ENCOUNTER — Telehealth: Payer: Self-pay | Admitting: Nurse Practitioner

## 2021-05-24 ENCOUNTER — Ambulatory Visit: Payer: 59 | Attending: Nurse Practitioner | Admitting: Pharmacist

## 2021-05-24 ENCOUNTER — Other Ambulatory Visit: Payer: Self-pay

## 2021-05-24 VITALS — BP 124/85 | HR 67 | Wt 119.6 lb

## 2021-05-24 DIAGNOSIS — I10 Essential (primary) hypertension: Secondary | ICD-10-CM

## 2021-05-24 NOTE — Telephone Encounter (Signed)
Patient would like to know her lab results  Please, call her back.

## 2021-05-26 NOTE — Telephone Encounter (Signed)
Left a Vm to return call to the office. Tobi Bastos # [phone]

## 2021-06-02 ENCOUNTER — Other Ambulatory Visit: Payer: Self-pay

## 2021-09-22 ENCOUNTER — Other Ambulatory Visit: Payer: Self-pay

## 2021-10-11 ENCOUNTER — Other Ambulatory Visit: Payer: Self-pay

## 2021-12-06 ENCOUNTER — Other Ambulatory Visit: Payer: Self-pay

## 2021-12-06 ENCOUNTER — Ambulatory Visit: Payer: 59 | Attending: Nurse Practitioner | Admitting: Nurse Practitioner

## 2021-12-06 ENCOUNTER — Encounter: Payer: Self-pay | Admitting: Nurse Practitioner

## 2021-12-06 VITALS — BP 138/84 | HR 67 | Temp 98.5°F | Ht <= 58 in | Wt 123.0 lb

## 2021-12-06 DIAGNOSIS — R7303 Prediabetes: Secondary | ICD-10-CM | POA: Diagnosis not present

## 2021-12-06 DIAGNOSIS — Z0000 Encounter for general adult medical examination without abnormal findings: Secondary | ICD-10-CM

## 2021-12-06 DIAGNOSIS — I10 Essential (primary) hypertension: Secondary | ICD-10-CM

## 2021-12-06 DIAGNOSIS — E559 Vitamin D deficiency, unspecified: Secondary | ICD-10-CM

## 2021-12-06 DIAGNOSIS — Z0001 Encounter for general adult medical examination with abnormal findings: Secondary | ICD-10-CM

## 2021-12-06 DIAGNOSIS — E782 Mixed hyperlipidemia: Secondary | ICD-10-CM | POA: Diagnosis not present

## 2021-12-06 DIAGNOSIS — D649 Anemia, unspecified: Secondary | ICD-10-CM

## 2021-12-06 MED ORDER — ATORVASTATIN CALCIUM 10 MG PO TABS
10.0000 mg | ORAL_TABLET | Freq: Every day | ORAL | 3 refills | Status: DC
  Filled 2021-12-06: qty 90, 90d supply, fill #0
  Filled 2022-03-21: qty 90, 90d supply, fill #1
  Filled 2022-09-17: qty 30, 30d supply, fill #2
  Filled 2022-11-14: qty 90, 90d supply, fill #3

## 2021-12-06 MED ORDER — AMLODIPINE BESYLATE 10 MG PO TABS
10.0000 mg | ORAL_TABLET | Freq: Every day | ORAL | 1 refills | Status: DC
  Filled 2021-12-06: qty 90, 90d supply, fill #0
  Filled 2022-03-21: qty 90, 90d supply, fill #1

## 2021-12-06 NOTE — Progress Notes (Signed)
? ?Assessment & Plan:  ?Sandra Gardner was seen today for annual exam. ? ?Diagnoses and all orders for this visit: ? ?Encounter for annual physical exam ? ?Primary hypertension ?-     amLODipine (NORVASC) 10 MG tablet; Take 1 tablet (10 mg total) by mouth daily. Please fill as a 90 day supply ?-     CMP14+EGFR ?Continue all antihypertensives as prescribed.  ?Remember to bring in your blood pressure log with you for your follow up appointment.  ?DASH/Mediterranean Diets are healthier choices for HTN.   ? ?Prediabetes ?-     Hemoglobin A1c ?-     CMP14+EGFR ? ?Mixed hyperlipidemia ?-     atorvastatin (LIPITOR) 10 MG tablet; Take 1 tablet (10 mg total) by mouth daily. Please fill as a 90 day supply ? ?Vitamin D deficiency disease ?-     VITAMIN D 25 Hydroxy (Vit-D Deficiency, Fractures) ? ?Anemia, unspecified type ?-     CBC with Differential ? ? ? ?Patient has been counseled on age-appropriate routine health concerns for screening and prevention. These are reviewed and up-to-date. Referrals have been placed accordingly. Immunizations are up-to-date or declined.    ?Subjective:  ? ?Chief Complaint  ?Patient presents with  ? Annual Exam  ? ?HPI ?Sandra Gardner 66 y.o. female presents to office today for annual physical. ?She has a past medical history of Cystocele, Hypertension, Urinary incontinence, and Vaginal atrophy.  ? ?VRI was used to communicate directly with patient for the entire encounter including providing detailed patient instructions.   ?  ? ? ?HTN ?Blood pressure is well controlled with amlodipine 10 mg daily.  ?BP Readings from Last 3 Encounters:  ?12/06/21 138/84  ?05/24/21 124/85  ?05/10/21 (!) 142/82  ?  ? ?Prediabetes ?Well controlled. She is currently not taking any oral diabetes medications. LDL at goal with atorvastatin 10 mg daily ?Lab Results  ?Component Value Date  ? HGBA1C 5.7 (H) 03/02/2021  ?  ?Lab Results  ?Component Value Date  ? [place] 68 03/02/2021  ?  ? ?Review of Systems   ?Constitutional:  Negative for fever, malaise/fatigue and weight loss.  ?HENT: Negative.  Negative for nosebleeds.   ?Eyes: Negative.  Negative for blurred vision, double vision and photophobia.  ?Respiratory: Negative.  Negative for cough and shortness of breath.   ?Cardiovascular: Negative.  Negative for chest pain, palpitations and leg swelling.  ?Gastrointestinal: Negative.  Negative for heartburn, nausea and vomiting.  ?Musculoskeletal: Negative.  Negative for myalgias.  ?Neurological: Negative.  Negative for dizziness, focal weakness, seizures and headaches.  ?Psychiatric/Behavioral: Negative.  Negative for suicidal ideas.   ? ?Past Medical History:  ?Diagnosis Date  ? Cystocele   ? Hypertension   ? Urinary incontinence   ? Vaginal atrophy   ? ? ?Past Surgical History:  ?Procedure Laterality Date  ? CESAREAN SECTION    ? 3 previous  ? CHOLECYSTECTOMY    ? COLONOSCOPY N/A 10/18/2016  ? Procedure: COLONOSCOPY;  Surgeon: Danie Binder, MD;  [facility];  Service: Endoscopy;  Laterality: N/A;  1230 ?  ? ENDOMETRIAL ABLATION  2012  ? SHOULDER SURGERY Right   ? TUBAL LIGATION    ? BTL-  [id number]  ? ? ?Family History  ?Problem Relation Age of Onset  ? Diabetes Father   ? Thyroid disease Sister   ? Hypertension Paternal Grandmother   ? Breast cancer Neg Hx   ? ? ?Social History Reviewed with no changes to be made today.  ? ?Outpatient  Medications Prior to Visit  ?Medication Sig Dispense Refill  ? Cholecalciferol (VITAMIN D3 PO) Take 1 tablet by mouth daily.    ? Omega 3 1000 MG CAPS Take 1,000 mg by mouth daily.    ? vitamin B-12 (CYANOCOBALAMIN) 1000 MCG tablet Take 1,000 mcg by mouth daily.    ? atorvastatin (LIPITOR) 10 MG tablet Take 1 tablet (10 mg total) by mouth daily. Please fill as a 90 day supply 90 tablet 3  ? amLODipine (NORVASC) 10 MG tablet Take 1 tablet (10 mg total) by mouth daily. (Patient not taking: Reported on 12/06/2021) 90 tablet 1  ? loratadine (CLARITIN) 10 MG tablet Take 10 mg by  mouth daily as needed for allergies. (Patient not taking: Reported on 12/06/2021)    ? ?No facility-administered medications prior to visit.  ? ? ?Allergies  ?Allergen Reactions  ? Apple Juice Swelling  ? Cherry   ?  swelling  ? Citrus Aurantium [Citrus]   ? ? ?   ?Objective:  ?  ?BP 138/84   Pulse 67   Temp 98.5 ?F (36.9 ?C) (Oral)   Ht _0  (1.448 m)   Wt 123 lb (55.8 kg)   SpO2 96%   BMI 26.62 kg/m?  ?Wt Readings from Last 3 Encounters:  ?12/06/21 123 lb (55.8 kg)  ?05/24/21 119 lb 9.6 oz (54.3 kg)  ?05/10/21 118 lb 6 oz (53.7 kg)  ? ? ?Physical Exam ?Vitals and nursing note reviewed.  ?Constitutional:   ?   Appearance: She is well-developed.  ?HENT:  ?   Head: Normocephalic and atraumatic.  ?Cardiovascular:  ?   Rate and Rhythm: Normal rate and regular rhythm.  ?   Heart sounds: Normal heart sounds. No murmur heard. ?  No friction rub. No gallop.  ?Pulmonary:  ?   Effort: Pulmonary effort is normal. No tachypnea or respiratory distress.  ?   Breath sounds: Normal breath sounds. No decreased breath sounds, wheezing, rhonchi or rales.  ?Chest:  ?   Chest wall: No tenderness.  ?Abdominal:  ?   General: Bowel sounds are normal.  ?   Palpations: Abdomen is soft.  ?Musculoskeletal:     ?   General: Normal range of motion.  ?   Cervical back: Normal range of motion.  ?Skin: ?   General: Skin is warm and dry.  ?Neurological:  ?   Mental Status: She is alert and oriented to person, place, and time.  ?   Coordination: Coordination normal.  ?Psychiatric:     ?   Behavior: Behavior normal. Behavior is cooperative.     ?   Thought Content: Thought content normal.     ?   Judgment: Judgment normal.  ? ? ? ? ?   ?Patient has been counseled extensively about nutrition and exercise as well as the importance of adherence with medications and regular follow-up. The patient was given clear instructions to go to ER or return to medical center if symptoms don't improve, worsen or new problems develop. The patient verbalized  understanding.  ? ?Follow-up: Return in about 6 months (around 06/08/2022).  ? ?Gildardo Pounds, FNP-BC ?[facility] ?[place], [place] ?[phone]   ?12/06/2021, 9:13 PM ?

## 2021-12-06 NOTE — Progress Notes (Signed)
No concerns today.  ?Medication refill- amlodipine ?Sandra Gardner  [id number] ?

## 2021-12-07 LAB — CBC WITH DIFFERENTIAL/PLATELET
Basophils Absolute: 0.1 10*3/uL (ref 0.0–0.2)
Basos: 1 %
EOS (ABSOLUTE): 0.7 10*3/uL — ABNORMAL HIGH (ref 0.0–0.4)
Eos: 6 %
Hematocrit: 41.5 % (ref 34.0–46.6)
Hemoglobin: 14 g/dL (ref 11.1–15.9)
Immature Grans (Abs): 0 10*3/uL (ref 0.0–0.1)
Immature Granulocytes: 0 %
Lymphocytes Absolute: 4.1 10*3/uL — ABNORMAL HIGH (ref 0.7–3.1)
Lymphs: 38 %
MCH: 29.5 pg (ref 26.6–33.0)
MCHC: 33.7 g/dL (ref 31.5–35.7)
MCV: 88 fL (ref 79–97)
Monocytes Absolute: 0.6 10*3/uL (ref 0.1–0.9)
Monocytes: 5 %
Neutrophils Absolute: 5.3 10*3/uL (ref 1.4–7.0)
Neutrophils: 50 %
Platelets: 251 10*3/uL (ref 150–450)
RBC: 4.74 x10E6/uL (ref 3.77–5.28)
RDW: 13 % (ref 11.7–15.4)
WBC: 10.7 10*3/uL (ref 3.4–10.8)

## 2021-12-07 LAB — VITAMIN D 25 HYDROXY (VIT D DEFICIENCY, FRACTURES): Vit D, 25-Hydroxy: 20.6 ng/mL — ABNORMAL LOW (ref 30.0–100.0)

## 2021-12-07 LAB — HEMOGLOBIN A1C
Est. average glucose Bld gHb Est-mCnc: 154 mg/dL
Hgb A1c MFr Bld: 7 % — ABNORMAL HIGH (ref 4.8–5.6)

## 2021-12-07 LAB — CMP14+EGFR
ALT: 20 IU/L (ref 0–32)
AST: 19 IU/L (ref 0–40)
Albumin/Globulin Ratio: 1.4 (ref 1.2–2.2)
Albumin: 4.5 g/dL (ref 3.8–4.8)
Alkaline Phosphatase: 117 IU/L (ref 44–121)
BUN/Creatinine Ratio: 15 (ref 12–28)
BUN: 11 mg/dL (ref 8–27)
Bilirubin Total: 0.2 mg/dL (ref 0.0–1.2)
CO2: 25 mmol/L (ref 20–29)
Calcium: 9.5 mg/dL (ref 8.7–10.3)
Chloride: 104 mmol/L (ref 96–106)
Creatinine, Ser: 0.72 mg/dL (ref 0.57–1.00)
Globulin, Total: 3.2 g/dL (ref 1.5–4.5)
Glucose: 194 mg/dL — ABNORMAL HIGH (ref 70–99)
Potassium: 4.1 mmol/L (ref 3.5–5.2)
Sodium: 141 mmol/L (ref 134–144)
Total Protein: 7.7 g/dL (ref 6.0–8.5)
eGFR: 93 mL/min/{1.73_m2} (ref >59)

## 2021-12-09 ENCOUNTER — Other Ambulatory Visit: Payer: Self-pay | Admitting: Nurse Practitioner

## 2021-12-09 DIAGNOSIS — E559 Vitamin D deficiency, unspecified: Secondary | ICD-10-CM

## 2021-12-09 DIAGNOSIS — E119 Type 2 diabetes mellitus without complications: Secondary | ICD-10-CM

## 2021-12-09 MED ORDER — METFORMIN HCL 500 MG PO TABS
500.0000 mg | ORAL_TABLET | Freq: Every day | ORAL | 1 refills | Status: DC
  Filled 2021-12-09: qty 90, 90d supply, fill #0

## 2021-12-09 MED ORDER — VITAMIN D (ERGOCALCIFEROL) 1.25 MG (50000 UNIT) PO CAPS
50000.0000 [IU] | ORAL_CAPSULE | ORAL | 1 refills | Status: DC
  Filled 2021-12-09: qty 12, 84d supply, fill #0
  Filled 2022-09-17: qty 4, 28d supply, fill #0
  Filled 2022-11-14: qty 4, 28d supply, fill #1

## 2021-12-11 ENCOUNTER — Other Ambulatory Visit: Payer: Self-pay

## 2021-12-19 ENCOUNTER — Other Ambulatory Visit: Payer: Self-pay

## 2022-01-11 ENCOUNTER — Other Ambulatory Visit: Payer: Self-pay | Admitting: Obstetrics and Gynecology

## 2022-01-11 DIAGNOSIS — Z1231 Encounter for screening mammogram for malignant neoplasm of breast: Secondary | ICD-10-CM

## 2022-01-31 ENCOUNTER — Other Ambulatory Visit: Payer: Self-pay

## 2022-01-31 DIAGNOSIS — Z1231 Encounter for screening mammogram for malignant neoplasm of breast: Secondary | ICD-10-CM

## 2022-01-31 NOTE — Addendum Note (Signed)
Addended by: Narda Rutherford on: 01/31/2022 04:02 PM   Modules accepted: Orders

## 2022-02-15 ENCOUNTER — Ambulatory Visit
Admission: RE | Admit: 2022-02-15 | Discharge: 2022-02-15 | Disposition: A | Discharge status: Home / Self Care | Payer: No Typology Code available for payment source | Source: Ambulatory Visit | Attending: Obstetrics and Gynecology | Admitting: Obstetrics and Gynecology

## 2022-02-15 ENCOUNTER — Ambulatory Visit: Payer: No Typology Code available for payment source

## 2022-02-15 DIAGNOSIS — Z1231 Encounter for screening mammogram for malignant neoplasm of breast: Secondary | ICD-10-CM

## 2022-03-21 ENCOUNTER — Other Ambulatory Visit: Payer: Self-pay

## 2022-06-13 ENCOUNTER — Other Ambulatory Visit: Payer: Self-pay

## 2022-06-13 ENCOUNTER — Encounter: Payer: Self-pay | Admitting: Nurse Practitioner

## 2022-06-13 ENCOUNTER — Ambulatory Visit: Payer: Self-pay | Attending: Nurse Practitioner | Admitting: Nurse Practitioner

## 2022-06-13 VITALS — BP 136/79 | HR 76 | Temp 97.8°F | Ht <= 58 in | Wt 122.6 lb

## 2022-06-13 DIAGNOSIS — E1165 Type 2 diabetes mellitus with hyperglycemia: Secondary | ICD-10-CM

## 2022-06-13 DIAGNOSIS — E785 Hyperlipidemia, unspecified: Secondary | ICD-10-CM

## 2022-06-13 DIAGNOSIS — I10 Essential (primary) hypertension: Secondary | ICD-10-CM

## 2022-06-13 LAB — POCT GLYCOSYLATED HEMOGLOBIN (HGB A1C): HbA1c, POC (controlled diabetic range): 7.5 % — AB (ref 0.0–7.0)

## 2022-06-13 MED ORDER — METFORMIN HCL 500 MG PO TABS
500.0000 mg | ORAL_TABLET | Freq: Every day | ORAL | 1 refills | Status: DC
  Filled 2022-06-13: qty 90, 90d supply, fill #0
  Filled 2022-09-17: qty 30, 30d supply, fill #1
  Filled 2022-11-14: qty 60, 60d supply, fill #2

## 2022-06-13 MED ORDER — AMLODIPINE BESYLATE 10 MG PO TABS
10.0000 mg | ORAL_TABLET | Freq: Every day | ORAL | 1 refills | Status: DC
  Filled 2022-06-13: qty 90, 90d supply, fill #0
  Filled 2022-09-17: qty 30, 30d supply, fill #1
  Filled 2022-11-14: qty 60, 60d supply, fill #2

## 2022-06-13 NOTE — Progress Notes (Signed)
Assessment & Plan:  Sandra Gardner was seen today for diabetes.  Diagnoses and all orders for this visit:  Type 2 diabetes mellitus with hyperglycemia, without long-term current use of insulin (HCC) -     POCT glycosylated hemoglobin (Hb A1C) -     metFORMIN (GLUCOPHAGE) 500 MG tablet; Take 1 tablet (500 mg total) by mouth daily with breakfast. -     CMP14+EGFR Continue blood sugar control as discussed in office today, low carbohydrate diet, and regular physical exercise as tolerated, 150 minutes per week (30 min each day, 5 days per week, or 50 min 3 days per week). Keep blood sugar logs with fasting goal of 90-130 mg/dl, post prandial (after you eat) less than 180.  For Hypoglycemia: BS <60 and Hyperglycemia BS >400; contact the clinic ASAP. Annual eye exams and foot exams are recommended.   Primary hypertension -     CMP14+EGFR -     amLODipine (NORVASC) 10 MG tablet; Take 1 tablet (10 mg total) by mouth daily. Please fill as a 90 day supply Continue all antihypertensives as prescribed.  Reminded to bring in blood pressure log for follow  up appointment.  RECOMMENDATIONS: DASH/Mediterranean Diets are healthier choices for HTN.    Dyslipidemia, goal LDL below 100 -     Lipid panel INSTRUCTIONS: Work on a low fat, heart healthy diet and participate in regular aerobic exercise program by working out at least 150 minutes per week; 5 days a week-30 minutes per day. Avoid red meat/beef/steak,  fried foods. junk foods, sodas, sugary drinks, unhealthy snacking, alcohol and smoking.  Drink at least 80 oz of water per day and monitor your carbohydrate intake daily.      Patient has been counseled on age-appropriate routine health concerns for screening and prevention. These are reviewed and up-to-date. Referrals have been placed accordingly. Immunizations are up-to-date or declined.    Subjective:   Chief Complaint  Patient presents with   Diabetes   HPI Sandra Gardner 66 y.o.  female presents to office today for follow up to DM and HTN  She has a past medical history of Cystocele, Hypertension, Urinary incontinence, DM 2 and Vaginal atrophy.    DM2 She was not aware she was diagnosed with DM 6 months ago. She does not monitor her mychart. Never picked up the metformin. Will start today. We discussed dietary modifications and exercise today.  Lab Results  Component Value Date   HGBA1C 7.5 (A) 06/13/2022    Lab Results  Component Value Date   HGBA1C 7.0 (H) 12/06/2021     HTN Blood pressure is well controlled with amlodipine 10 mg daily.  BP Readings from Last 3 Encounters:  06/13/22 136/79  12/06/21 138/84  05/24/21 124/85    Review of Systems  Constitutional:  Negative for fever, malaise/fatigue and weight loss.  HENT: Negative.  Negative for nosebleeds.   Eyes: Negative.  Negative for blurred vision, double vision and photophobia.  Respiratory: Negative.  Negative for cough and shortness of breath.   Cardiovascular: Negative.  Negative for chest pain, palpitations and leg swelling.  Gastrointestinal: Negative.  Negative for heartburn, nausea and vomiting.  Musculoskeletal: Negative.  Negative for myalgias.  Neurological: Negative.  Negative for dizziness, focal weakness, seizures and headaches.  Psychiatric/Behavioral: Negative.  Negative for suicidal ideas.     Past Medical History:  Diagnosis Date   Cystocele    Hypertension    Urinary incontinence    Vaginal atrophy     Past  Surgical History:  Procedure Laterality Date   CESAREAN SECTION     3 previous   CHOLECYSTECTOMY     COLONOSCOPY N/A 10/18/2016   Procedure: COLONOSCOPY;  Surgeon: Danie Binder, MD;  [facility];  Service: Endoscopy;  Laterality: N/A;  3    ENDOMETRIAL ABLATION  2012   SHOULDER SURGERY Right    TUBAL LIGATION     BTL-  [id number]    Family History  Problem Relation Age of Onset   Diabetes Father    Thyroid disease Sister    Hypertension  Paternal Grandmother    Breast cancer Neg Hx     Social History Reviewed with no changes to be made today.   Outpatient Medications Prior to Visit  Medication Sig Dispense Refill   atorvastatin (LIPITOR) 10 MG tablet Take 1 tablet (10 mg total) by mouth daily. Please fill as a 90 day supply 90 tablet 3   Cholecalciferol (VITAMIN D3 PO) Take 1 tablet by mouth daily.     Omega 3 1000 MG CAPS Take 1,000 mg by mouth daily.     vitamin B-12 (CYANOCOBALAMIN) 1000 MCG tablet Take 1,000 mcg by mouth daily.     Vitamin D, Ergocalciferol, (DRISDOL) 1.25 MG (50000 UNIT) CAPS capsule Take 1 capsule (50,000 Units total) by mouth every 7 (seven) days. 12 capsule 1   amLODipine (NORVASC) 10 MG tablet Take 1 tablet (10 mg total) by mouth daily. Please fill as a 90 day supply 90 tablet 1   metFORMIN (GLUCOPHAGE) 500 MG tablet Take 1 tablet (500 mg total) by mouth daily with breakfast. (Patient not taking: Reported on 06/13/2022) 90 tablet 1   No facility-administered medications prior to visit.    Allergies  Allergen Reactions   Apple Juice Swelling   Cherry     swelling   Citrus Aurantium [Citrus]        Objective:    BP 136/79   Pulse 76   Temp 97.8 F (36.6 C) (Temporal)   Ht _0  (1.448 m)   Wt 122 lb 9.6 oz (55.6 kg)   SpO2 98%   BMI 26.53 kg/m  Wt Readings from Last 3 Encounters:  06/13/22 122 lb 9.6 oz (55.6 kg)  12/06/21 123 lb (55.8 kg)  05/24/21 119 lb 9.6 oz (54.3 kg)    Physical Exam Vitals and nursing note reviewed.  Constitutional:      Appearance: She is well-developed.  HENT:     Head: Normocephalic and atraumatic.  Cardiovascular:     Rate and Rhythm: Normal rate and regular rhythm.     Heart sounds: Normal heart sounds. No murmur heard.    No friction rub. No gallop.  Pulmonary:     Effort: Pulmonary effort is normal. No tachypnea or respiratory distress.     Breath sounds: Normal breath sounds. No decreased breath sounds, wheezing, rhonchi or rales.   Chest:     Chest wall: No tenderness.  Abdominal:     General: Bowel sounds are normal.     Palpations: Abdomen is soft.  Musculoskeletal:        General: Normal range of motion.     Cervical back: Normal range of motion.  Skin:    General: Skin is warm and dry.  Neurological:     Mental Status: She is alert and oriented to person, place, and time.     Coordination: Coordination normal.  Psychiatric:        Behavior: Behavior normal. Behavior is cooperative.  Thought Content: Thought content normal.        Judgment: Judgment normal.          Patient has been counseled extensively about nutrition and exercise as well as the importance of adherence with medications and regular follow-up. The patient was given clear instructions to go to ER or return to medical center if symptoms don't improve, worsen or new problems develop. The patient verbalized understanding.   Follow-up: Return in about 3 months (around 09/13/2022) for see me back the first week of February  A1c.   Gildardo Pounds, FNP-BC [facility] and [facility] [place], [place]   06/13/2022, 1:59 PM

## 2022-06-14 LAB — CMP14+EGFR
ALT: 24 IU/L (ref 0–32)
AST: 27 IU/L (ref 0–40)
Albumin/Globulin Ratio: 1.7 (ref 1.2–2.2)
Albumin: 4.8 g/dL (ref 3.9–4.9)
Alkaline Phosphatase: 120 IU/L (ref 44–121)
BUN/Creatinine Ratio: 22 (ref 12–28)
BUN: 13 mg/dL (ref 8–27)
Bilirubin Total: 0.3 mg/dL (ref 0.0–1.2)
CO2: 24 mmol/L (ref 20–29)
Calcium: 10 mg/dL (ref 8.7–10.3)
Chloride: 103 mmol/L (ref 96–106)
Creatinine, Ser: 0.6 mg/dL (ref 0.57–1.00)
Globulin, Total: 2.8 g/dL (ref 1.5–4.5)
Glucose: 124 mg/dL — ABNORMAL HIGH (ref 70–99)
Potassium: 4.5 mmol/L (ref 3.5–5.2)
Sodium: 141 mmol/L (ref 134–144)
Total Protein: 7.6 g/dL (ref 6.0–8.5)
eGFR: 100 mL/min/{1.73_m2} (ref >59)

## 2022-06-14 LAB — LIPID PANEL
Chol/HDL Ratio: 3.3 ratio (ref 0.0–4.4)
Cholesterol, Total: 165 mg/dL (ref 100–199)
HDL: 50 mg/dL (ref >39)
LDL Chol Calc (NIH): 89 mg/dL (ref 0–99)
Triglycerides: 148 mg/dL (ref 0–149)
VLDL Cholesterol Cal: 26 mg/dL (ref 5–40)

## 2022-06-15 ENCOUNTER — Other Ambulatory Visit: Payer: Self-pay

## 2022-06-22 ENCOUNTER — Other Ambulatory Visit: Payer: Self-pay

## 2022-06-29 ENCOUNTER — Other Ambulatory Visit: Payer: Self-pay

## 2022-08-24 ENCOUNTER — Ambulatory Visit: Payer: Self-pay | Admitting: Nurse Practitioner

## 2022-09-17 ENCOUNTER — Encounter: Payer: Self-pay | Admitting: Nurse Practitioner

## 2022-09-17 ENCOUNTER — Ambulatory Visit: Payer: Medicare Other | Attending: Nurse Practitioner | Admitting: Nurse Practitioner

## 2022-09-17 ENCOUNTER — Other Ambulatory Visit: Payer: Self-pay

## 2022-09-17 VITALS — BP 168/94 | HR 73 | Ht <= 58 in | Wt 116.4 lb

## 2022-09-17 DIAGNOSIS — I10 Essential (primary) hypertension: Secondary | ICD-10-CM | POA: Diagnosis present

## 2022-09-17 DIAGNOSIS — Z780 Asymptomatic menopausal state: Secondary | ICD-10-CM

## 2022-09-17 DIAGNOSIS — Z1231 Encounter for screening mammogram for malignant neoplasm of breast: Secondary | ICD-10-CM

## 2022-09-17 DIAGNOSIS — E1165 Type 2 diabetes mellitus with hyperglycemia: Secondary | ICD-10-CM

## 2022-09-17 DIAGNOSIS — E782 Mixed hyperlipidemia: Secondary | ICD-10-CM | POA: Diagnosis not present

## 2022-09-17 DIAGNOSIS — Z862 Personal history of diseases of the blood and blood-forming organs and certain disorders involving the immune mechanism: Secondary | ICD-10-CM

## 2022-09-17 DIAGNOSIS — E559 Vitamin D deficiency, unspecified: Secondary | ICD-10-CM | POA: Diagnosis not present

## 2022-09-17 DIAGNOSIS — Z13820 Encounter for screening for osteoporosis: Secondary | ICD-10-CM

## 2022-09-17 LAB — POCT GLYCOSYLATED HEMOGLOBIN (HGB A1C): Hemoglobin A1C: 7 % — AB (ref 4.0–5.6)

## 2022-09-17 LAB — GLUCOSE, POCT (MANUAL RESULT ENTRY): POC Glucose: 122 mg/dl — AB (ref 70–99)

## 2022-09-17 NOTE — Progress Notes (Signed)
Assessment & Plan:  Sandra Gardner was seen today for hypertension and diabetes.  Diagnoses and all orders for this visit:  Type 2 diabetes mellitus with hyperglycemia, without long-term current use of insulin (HCC) -     POCT glycosylated hemoglobin (Hb A1C) -     POCT glucose (manual entry) -     CMP14+EGFR -     Microalbumin / creatinine urine ratio Continue blood sugar control as discussed in office today, low carbohydrate diet, and regular physical exercise as tolerated, 150 minutes per week (30 min each day, 5 days per week, or 50 min 3 days per week). Keep blood sugar logs with fasting goal of 90-130 mg/dl, post prandial (after you eat) less than 180.  For Hypoglycemia: BS <60 and Hyperglycemia BS >400; contact the clinic ASAP. Annual eye exams and foot exams are recommended.   Primary hypertension -     CMP14+EGFR Continue all antihypertensives as prescribed.  Reminded to bring in blood pressure log for follow  up appointment.  RECOMMENDATIONS: DASH/Mediterranean Diets are healthier choices for HTN.    Mixed hyperlipidemia INSTRUCTIONS: Work on a low fat, heart healthy diet and participate in regular aerobic exercise program by working out at least 150 minutes per week; 5 days a week-30 minutes per day. Avoid red meat/beef/steak,  fried foods. junk foods, sodas, sugary drinks, unhealthy snacking, alcohol and smoking.  Drink at least 80 oz of water per day and monitor your carbohydrate intake daily.    Osteoporosis screening -     DG Bone Density; Future  Breast cancer screening by mammogram -     MM 3D SCREEN BREAST BILATERAL; Future  History of anemia -     CBC with Differential  Asymptomatic menopausal state -     DG Bone Density; Future  Vitamin D deficiency disease -     VITAMIN D 25 Hydroxy (Vit-D Deficiency, Fractures)    Patient has been counseled on age-appropriate routine health concerns for screening and prevention. These are reviewed and up-to-date. Referrals  have been placed accordingly. Immunizations are up-to-date or declined.    Subjective:   Chief Complaint  Patient presents with   Hypertension   Diabetes   HPI Sandra Gardner 67 y.o. female presents to office today for follow up to HTN, DM and HPL  She has a past medical history of Cystocele, Hypertension, Urinary incontinence, DM 2 and Vaginal atrophy.   VRI was used to communicate directly with patient for the entire encounter including providing detailed patient instructions.     HTN Blood pressure is elevated today. She endorses adherence taking amlodipine 10 mg daily.  BP Readings from Last 3 Encounters:  09/17/22 (!) 168/94  06/13/22 136/79  12/06/21 138/84    Prediabetes Well controlled. She is currently taking metformin 500 mg daily. LDL not at goal with atorvastatin 10 mg daily A1c down from 7.5 to 7.0.  Lab Results  Component Value Date   HGBA1C 7.0 (A) 09/17/2022    Lab Results  Component Value Date   LDLCALC 89 06/13/2022      Review of Systems  Constitutional:  Negative for fever, malaise/fatigue and weight loss.  HENT: Negative.  Negative for nosebleeds.   Eyes: Negative.  Negative for blurred vision, double vision and photophobia.  Respiratory: Negative.  Negative for cough and shortness of breath.   Cardiovascular: Negative.  Negative for chest pain, palpitations and leg swelling.  Gastrointestinal: Negative.  Negative for heartburn, nausea and vomiting.  Musculoskeletal: Negative.  Negative for myalgias.  Neurological: Negative.  Negative for dizziness, focal weakness, seizures and headaches.  Psychiatric/Behavioral: Negative.  Negative for suicidal ideas.     Past Medical History:  Diagnosis Date   Cystocele    Diabetes mellitus without complication ([place])    Hypertension    Urinary incontinence    Vaginal atrophy     Past Surgical History:  Procedure Laterality Date   CESAREAN SECTION     3 previous   CHOLECYSTECTOMY      COLONOSCOPY N/A 10/18/2016   Procedure: COLONOSCOPY;  Surgeon: Danie Binder, MD;  [facility];  Service: Endoscopy;  Laterality: N/A;  15    ENDOMETRIAL ABLATION  2012   SHOULDER SURGERY Right    TUBAL LIGATION     BTLIM:[id number]    Family History  Problem Relation Age of Onset   Diabetes Father    Thyroid disease Sister    Hypertension Paternal Grandmother    Breast cancer Neg Hx     Social History Reviewed with no changes to be made today.   Outpatient Medications Prior to Visit  Medication Sig Dispense Refill   amLODipine (NORVASC) 10 MG tablet Take 1 tablet (10 mg total) by mouth daily. 90 tablet 1   atorvastatin (LIPITOR) 10 MG tablet Take 1 tablet (10 mg total) by mouth daily. Please fill as a 90 day supply 90 tablet 3   Cholecalciferol (VITAMIN D3 PO) Take 1 tablet by mouth daily.     metFORMIN (GLUCOPHAGE) 500 MG tablet Take 1 tablet (500 mg total) by mouth daily with breakfast. 90 tablet 1   Omega 3 1000 MG CAPS Take 1,000 mg by mouth daily.     vitamin B-12 (CYANOCOBALAMIN) 1000 MCG tablet Take 1,000 mcg by mouth daily.     Vitamin D, Ergocalciferol, (DRISDOL) 1.25 MG (50000 UNIT) CAPS capsule Take 1 capsule (50,000 Units total) by mouth every 7 (seven) days. 12 capsule 1   No facility-administered medications prior to visit.    Allergies  Allergen Reactions   Apple Juice Swelling   Cherry     swelling   Citrus Aurantium [Citrus]        Objective:    BP (!) 168/94   Pulse 73   Ht '4\' 9"'$  (1.448 m)   Wt 116 lb 6.4 oz (52.8 kg)   SpO2 98%   BMI 25.19 kg/m  Wt Readings from Last 3 Encounters:  09/17/22 116 lb 6.4 oz (52.8 kg)  06/13/22 122 lb 9.6 oz (55.6 kg)  12/06/21 123 lb (55.8 kg)    Physical Exam Vitals and nursing note reviewed.  Constitutional:      Appearance: She is well-developed.  HENT:     Head: Normocephalic and atraumatic.  Cardiovascular:     Rate and Rhythm: Normal rate and regular rhythm.     Heart sounds: Normal  heart sounds. No murmur heard.    No friction rub. No gallop.  Pulmonary:     Effort: Pulmonary effort is normal. No tachypnea or respiratory distress.     Breath sounds: Normal breath sounds. No decreased breath sounds, wheezing, rhonchi or rales.  Chest:     Chest wall: No tenderness.  Abdominal:     General: Bowel sounds are normal.     Palpations: Abdomen is soft.  Musculoskeletal:        General: Normal range of motion.     Cervical back: Normal range of motion.  Skin:    General: Skin is  warm and dry.  Neurological:     Mental Status: She is alert and oriented to person, place, and time.     Coordination: Coordination normal.  Psychiatric:        Behavior: Behavior normal. Behavior is cooperative.        Thought Content: Thought content normal.        Judgment: Judgment normal.          Patient has been counseled extensively about nutrition and exercise as well as the importance of adherence with medications and regular follow-up. The patient was given clear instructions to go to ER or return to medical center if symptoms don't improve, worsen or new problems develop. The patient verbalized understanding.   Follow-up: Return in about 3 months (around 12/16/2022).   Gildardo Pounds, FNP-BC [facility] and [facility], [place]   09/23/2022, 10:06 PM

## 2022-09-18 LAB — CMP14+EGFR
ALT: 21 IU/L (ref 0–32)
AST: 24 IU/L (ref 0–40)
Albumin/Globulin Ratio: 1.4 (ref 1.2–2.2)
Albumin: 4.6 g/dL (ref 3.9–4.9)
Alkaline Phosphatase: 107 IU/L (ref 44–121)
BUN/Creatinine Ratio: 21 (ref 12–28)
BUN: 13 mg/dL (ref 8–27)
Bilirubin Total: 0.3 mg/dL (ref 0.0–1.2)
CO2: 24 mmol/L (ref 20–29)
Calcium: 9.9 mg/dL (ref 8.7–10.3)
Chloride: 101 mmol/L (ref 96–106)
Creatinine, Ser: 0.61 mg/dL (ref 0.57–1.00)
Globulin, Total: 3.3 g/dL (ref 1.5–4.5)
Glucose: 116 mg/dL — ABNORMAL HIGH (ref 70–99)
Potassium: 3.6 mmol/L (ref 3.5–5.2)
Sodium: 140 mmol/L (ref 134–144)
Total Protein: 7.9 g/dL (ref 6.0–8.5)
eGFR: 99 mL/min/{1.73_m2} (ref >59)

## 2022-09-18 LAB — CBC WITH DIFFERENTIAL/PLATELET
Basophils Absolute: 0.1 10*3/uL (ref 0.0–0.2)
Basos: 1 %
EOS (ABSOLUTE): 0.6 10*3/uL — ABNORMAL HIGH (ref 0.0–0.4)
Eos: 6 %
Hematocrit: 44.3 % (ref 34.0–46.6)
Hemoglobin: 14.8 g/dL (ref 11.1–15.9)
Immature Grans (Abs): 0 10*3/uL (ref 0.0–0.1)
Immature Granulocytes: 0 %
Lymphocytes Absolute: 3.6 10*3/uL — ABNORMAL HIGH (ref 0.7–3.1)
Lymphs: 35 %
MCH: 28.9 pg (ref 26.6–33.0)
MCHC: 33.4 g/dL (ref 31.5–35.7)
MCV: 87 fL (ref 79–97)
Monocytes Absolute: 0.5 10*3/uL (ref 0.1–0.9)
Monocytes: 4 %
Neutrophils Absolute: 5.5 10*3/uL (ref 1.4–7.0)
Neutrophils: 54 %
Platelets: 265 10*3/uL (ref 150–450)
RBC: 5.12 x10E6/uL (ref 3.77–5.28)
RDW: 13.1 % (ref 11.7–15.4)
WBC: 10.3 10*3/uL (ref 3.4–10.8)

## 2022-09-18 LAB — VITAMIN D 25 HYDROXY (VIT D DEFICIENCY, FRACTURES): Vit D, 25-Hydroxy: 26 ng/mL — ABNORMAL LOW (ref 30.0–100.0)

## 2022-09-18 LAB — MICROALBUMIN / CREATININE URINE RATIO
Creatinine, Urine: 8.1 mg/dL
Microalbumin, Urine: <3 ug/mL

## 2022-09-23 ENCOUNTER — Encounter: Payer: Self-pay | Admitting: Nurse Practitioner

## 2022-11-02 IMAGING — MG MM DIGITAL SCREENING BILAT W/ TOMO AND CAD
8 series · 9 of 24 positions shown · non-contrast
Comparison: Previous exam(s).

CLINICAL DATA: Screening.

EXAM:
DIGITAL SCREENING BILATERAL MAMMOGRAM WITH TOMOSYNTHESIS AND CAD
TECHNIQUE: Bilateral screening digital craniocaudal and mediolateral oblique
mammograms were obtained. Bilateral screening digital breast
tomosynthesis was performed. The images were evaluated with
computer-aided detection.

[R CC synth-2D]
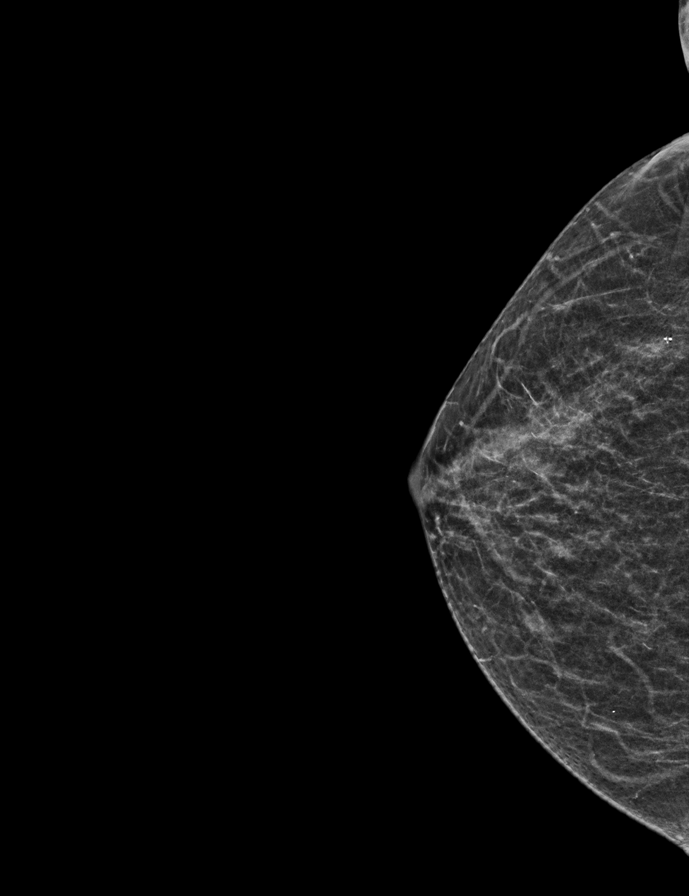

[R MLO synth-2D]
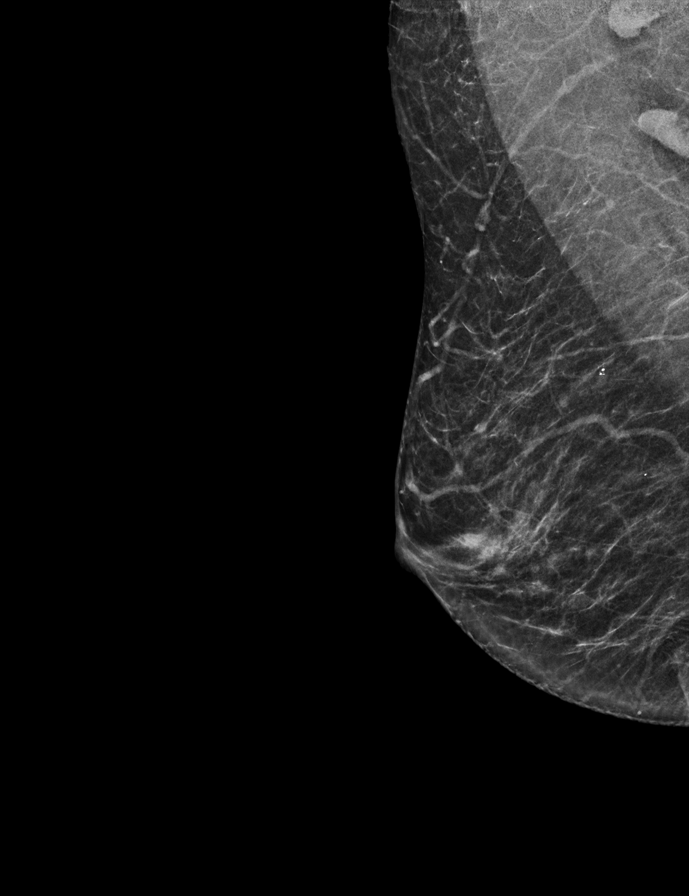

[L MLO synth-2D]
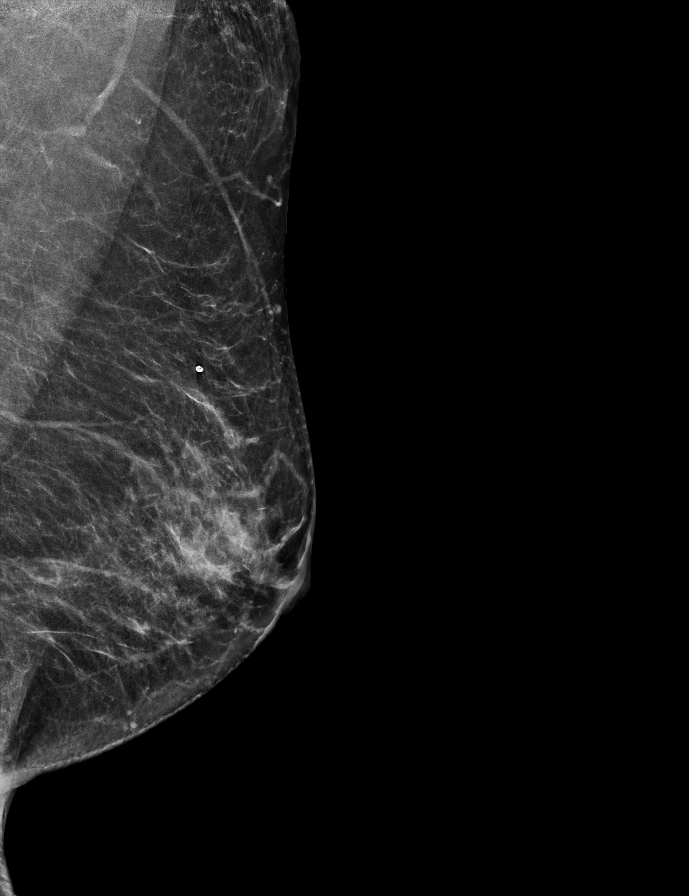

[L CC synth-2D]
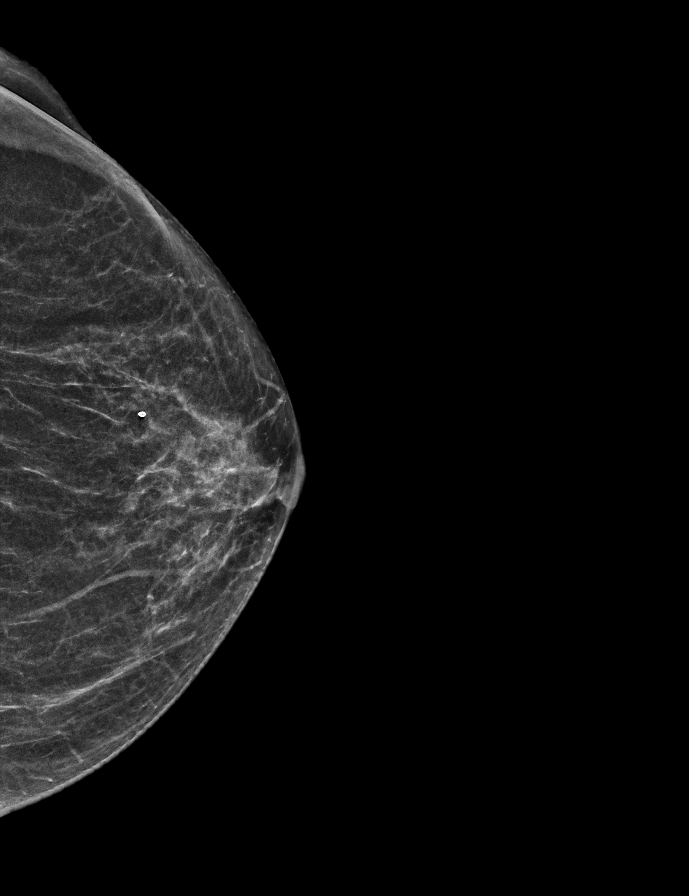

[R CC tomo · 2 of 39 frames shown]
[frame 13/39]
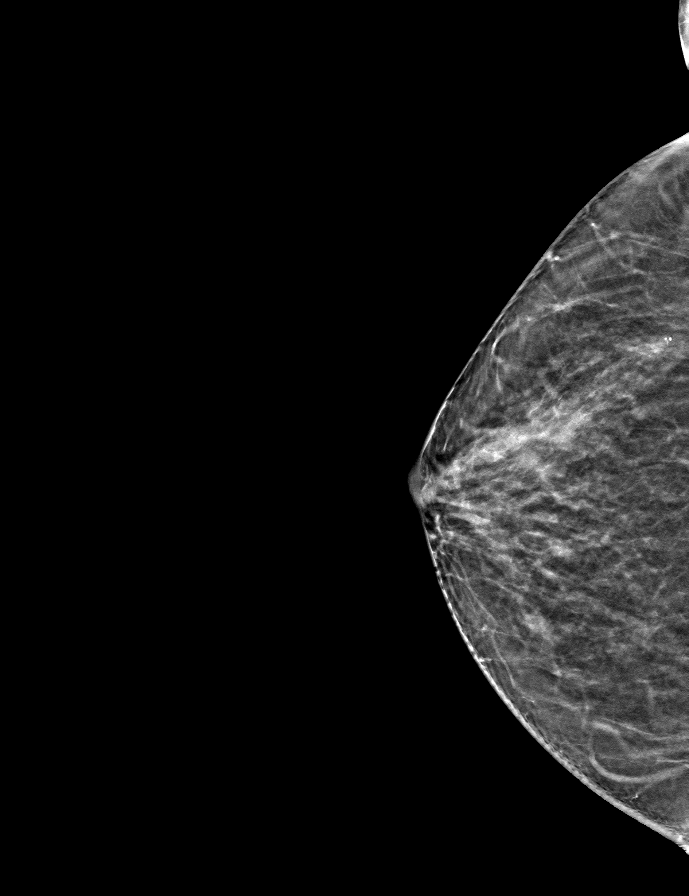
[frame 20/39]
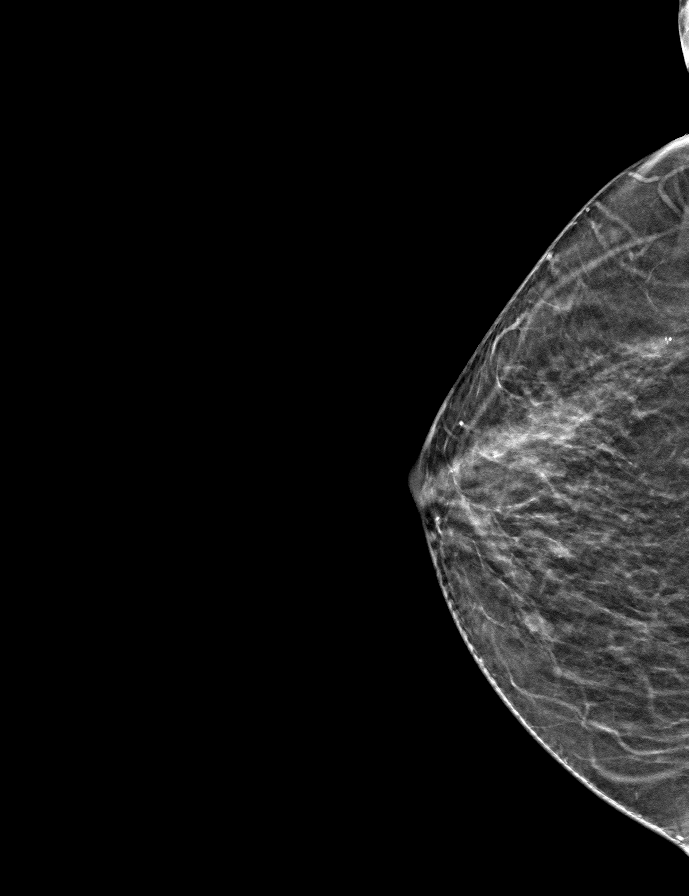

[L CC tomo · tomo slice 24/47.0]
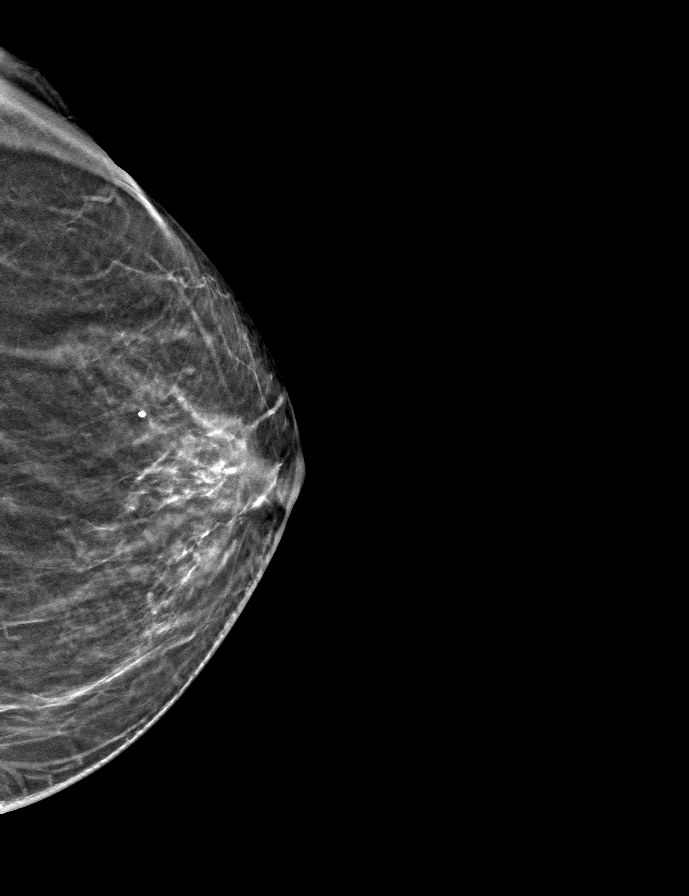

[L MLO tomo · tomo slice 29/57.0]
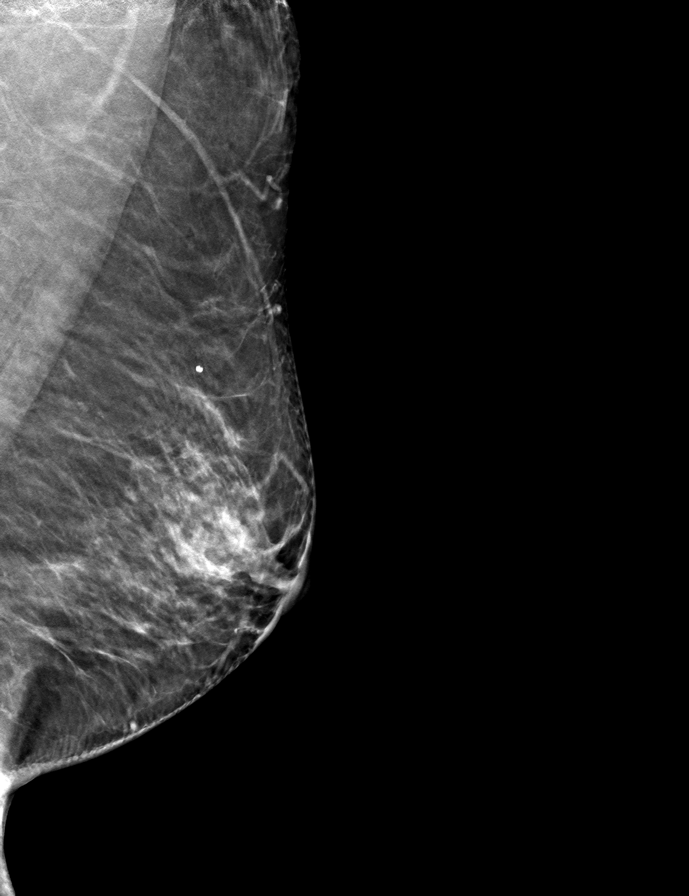

[R MLO tomo · tomo slice 25/50.0]
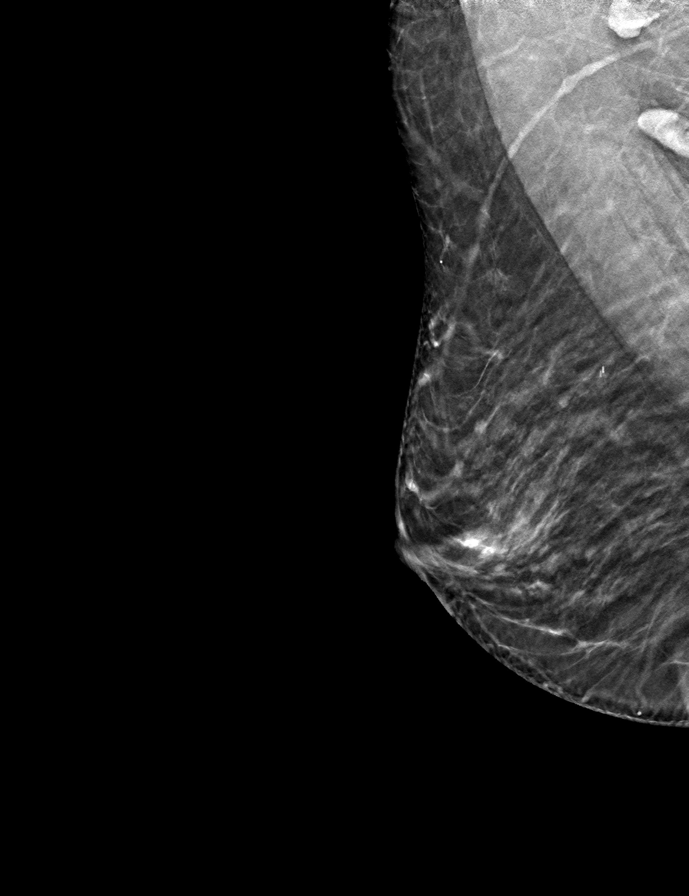

[9 of 24 positions shown; findings below may reference images not displayed]

ACR Breast Density Category b: There are scattered areas of
fibroglandular density.
FINDINGS: There are no findings suspicious for malignancy.
IMPRESSION: No mammographic evidence of malignancy. A result letter of this
screening mammogram will be mailed directly to the patient.

RECOMMENDATION:
Screening mammogram in one year. (Code:51-O-LD2)

BI-RADS CATEGORY  1: Negative.

## 2022-11-14 ENCOUNTER — Other Ambulatory Visit: Payer: Self-pay

## 2022-12-19 ENCOUNTER — Ambulatory Visit: Payer: Medicare Other | Admitting: Nurse Practitioner

## 2023-03-27 ENCOUNTER — Ambulatory Visit: Payer: Medicare Other | Attending: Nurse Practitioner | Admitting: Nurse Practitioner

## 2023-03-27 ENCOUNTER — Encounter: Payer: Self-pay | Admitting: Nurse Practitioner

## 2023-03-27 ENCOUNTER — Other Ambulatory Visit: Payer: Self-pay

## 2023-03-27 VITALS — BP 145/75 | HR 70 | Ht <= 58 in | Wt 120.4 lb

## 2023-03-27 DIAGNOSIS — Z713 Dietary counseling and surveillance: Secondary | ICD-10-CM | POA: Insufficient documentation

## 2023-03-27 DIAGNOSIS — I10 Essential (primary) hypertension: Secondary | ICD-10-CM | POA: Insufficient documentation

## 2023-03-27 DIAGNOSIS — E1165 Type 2 diabetes mellitus with hyperglycemia: Secondary | ICD-10-CM | POA: Diagnosis present

## 2023-03-27 DIAGNOSIS — Z7984 Long term (current) use of oral hypoglycemic drugs: Secondary | ICD-10-CM | POA: Insufficient documentation

## 2023-03-27 DIAGNOSIS — Z79899 Other long term (current) drug therapy: Secondary | ICD-10-CM | POA: Insufficient documentation

## 2023-03-27 LAB — POCT GLYCOSYLATED HEMOGLOBIN (HGB A1C): Hemoglobin A1C: 7 % — AB (ref 4.0–5.6)

## 2023-03-27 MED ORDER — METFORMIN HCL 500 MG PO TABS
500.0000 mg | ORAL_TABLET | Freq: Two times a day (BID) | ORAL | 1 refills | Status: DC
  Filled 2023-03-27: qty 180, 90d supply, fill #0
  Filled 2023-08-01: qty 180, 90d supply, fill #1

## 2023-03-27 NOTE — Progress Notes (Signed)
Assessment & Plan:  Sandra Gardner was seen today for medical management of chronic issues.  Diagnoses and all orders for this visit:  Type 2 diabetes mellitus with hyperglycemia, without long-term current use of insulin (HCC) -     POCT glycosylated hemoglobin (Hb A1C) -     metFORMIN (GLUCOPHAGE) 500 MG tablet; Take 1 tablet (500 mg total) by mouth 2 (two) times daily with a meal. Continue blood sugar control as discussed in office today, low carbohydrate diet, and regular physical exercise as tolerated, 150 minutes per week (30 min each day, 5 days per week, or 50 min 3 days per week). Keep blood sugar logs with fasting goal of 90-130 mg/dl, post prandial (after you eat) less than 180.  For Hypoglycemia: BS <60 and Hyperglycemia BS >400; contact the clinic ASAP. Annual eye exams and foot exams are recommended.   Primary hypertension Purchase blood pressure monitor. Follow up in 4 weeks with readings. If still elevated will need to start ACE/ARB -     amLODipine (NORVASC) 10 MG tablet; Take 1 tablet (10 mg total) by mouth daily.    Patient has been counseled on age-appropriate routine health concerns for screening and prevention. These are reviewed and up-to-date. Referrals have been placed accordingly. Immunizations are up-to-date or declined.    Subjective:   Chief Complaint  Patient presents with   Medical Management of Chronic Issues   HPI Sandra Gardner 67 y.o. female presents to office today for follow up to DM and HTN  She has a past medical history of Cystocele, Hypertension, Urinary incontinence, DM 2 and Vaginal atrophy.    VRI was used to communicate directly with patient for the entire encounter including providing detailed patient instructions.   HTN Blood pressure is elevated today. She endorses adherence taking amlodipine 10 mg daily although last dispense date is in April.  BP Readings from Last 3 Encounters:  03/27/23 (!) 145/75  09/17/22 (!) 168/94   06/13/22 136/79     DM 2 Not quite at goal of less than 7. She is currently taking metformin 500 mg daily. LDL not at goal of <70 with atorvastatin 10 mg daily.  Lab Results  Component Value Date   HGBA1C 7.0 (A) 03/27/2023   Lab Results  Component Value Date   LDLCALC 89 06/13/2022      Review of Systems  Constitutional:  Negative for fever, malaise/fatigue and weight loss.  HENT: Negative.  Negative for nosebleeds.   Eyes: Negative.  Negative for blurred vision, double vision and photophobia.  Respiratory: Negative.  Negative for cough and shortness of breath.   Cardiovascular: Negative.  Negative for chest pain, palpitations and leg swelling.  Gastrointestinal: Negative.  Negative for heartburn, nausea and vomiting.  Musculoskeletal: Negative.  Negative for myalgias.  Neurological: Negative.  Negative for dizziness, focal weakness, seizures and headaches.  Psychiatric/Behavioral: Negative.  Negative for suicidal ideas.     Past Medical History:  Diagnosis Date   Cystocele    Diabetes mellitus without complication (HCC)    Hypertension    Urinary incontinence    Vaginal atrophy     Past Surgical History:  Procedure Laterality Date   CESAREAN SECTION     3 previous   CHOLECYSTECTOMY     COLONOSCOPY N/A 10/18/2016   Procedure: COLONOSCOPY;  Surgeon: West Bali, MD;  [facility];  Service: Endoscopy;  Laterality: N/A;  1230    ENDOMETRIAL ABLATION  2012   SHOULDER SURGERY Right  TUBAL LIGATION     BTL-  07-6107    Family History  Problem Relation Age of Onset   Diabetes Father    Thyroid disease Sister    Hypertension Paternal Grandmother    Breast cancer Neg Hx     Social History Reviewed with no changes to be made today.   Outpatient Medications Prior to Visit  Medication Sig Dispense Refill   atorvastatin (LIPITOR) 10 MG tablet Take 1 tablet (10 mg total) by mouth daily. Please fill as a 90 day supply 90 tablet 3   Cholecalciferol  (VITAMIN D3 PO) Take 1 tablet by mouth daily.     Omega 3 1000 MG CAPS Take 1,000 mg by mouth daily.     vitamin B-12 (CYANOCOBALAMIN) 1000 MCG tablet Take 1,000 mcg by mouth daily.     Vitamin D, Ergocalciferol, (DRISDOL) 1.25 MG (50000 UNIT) CAPS capsule Take 1 capsule (50,000 Units total) by mouth every 7 (seven) days. 12 capsule 1   amLODipine (NORVASC) 10 MG tablet Take 1 tablet (10 mg total) by mouth daily. 90 tablet 1   metFORMIN (GLUCOPHAGE) 500 MG tablet Take 1 tablet (500 mg total) by mouth daily with breakfast. 90 tablet 1   No facility-administered medications prior to visit.    Allergies  Allergen Reactions   Apple Juice Swelling   Cherry     swelling   Citrus Aurantium [Citrus]        Objective:    BP (!) 145/75 (BP Location: Left Arm, Patient Position: Sitting, Cuff Size: Normal)   Pulse 70   Ht 4\' 9"  (1.448 m)   Wt 120 lb 6.4 oz (54.6 kg)   LMP  (LMP Unknown)   SpO2 98%   BMI 26.05 kg/m  Wt Readings from Last 3 Encounters:  03/27/23 120 lb 6.4 oz (54.6 kg)  09/17/22 116 lb 6.4 oz (52.8 kg)  06/13/22 122 lb 9.6 oz (55.6 kg)    Physical Exam Vitals and nursing note reviewed.  Constitutional:      Appearance: She is well-developed.  HENT:     Head: Normocephalic and atraumatic.  Cardiovascular:     Rate and Rhythm: Normal rate and regular rhythm.     Heart sounds: Normal heart sounds. No murmur heard.    No friction rub. No gallop.  Pulmonary:     Effort: Pulmonary effort is normal. No tachypnea or respiratory distress.     Breath sounds: Normal breath sounds. No decreased breath sounds, wheezing, rhonchi or rales.  Chest:     Chest wall: No tenderness.  Abdominal:     General: Bowel sounds are normal.     Palpations: Abdomen is soft.  Musculoskeletal:        General: Normal range of motion.     Cervical back: Normal range of motion.  Skin:    General: Skin is warm and dry.  Neurological:     Mental Status: She is alert and oriented to person,  place, and time.     Coordination: Coordination normal.  Psychiatric:        Behavior: Behavior normal. Behavior is cooperative.        Thought Content: Thought content normal.        Judgment: Judgment normal.          Patient has been counseled extensively about nutrition and exercise as well as the importance of adherence with medications and regular follow-up. The patient was given clear instructions to go to ER or return to medical  center if symptoms don't improve, worsen or new problems develop. The patient verbalized understanding.   Follow-up: Return in about 4 weeks (around 04/24/2023) for BP CHECK WITH LUKE me or angela .   Claiborne Rigg, FNP-BC [facility] [place], [place] [phone]   04/01/2023, 7:32 PM

## 2023-03-27 NOTE — Patient Instructions (Signed)
[facility] Address: [address], [place], [place] [address] Hours:  Open ? Closes 5:30?PM Phone: [phone]

## 2023-04-01 ENCOUNTER — Encounter: Payer: Self-pay | Admitting: Nurse Practitioner

## 2023-04-01 MED ORDER — AMLODIPINE BESYLATE 10 MG PO TABS
10.0000 mg | ORAL_TABLET | Freq: Every day | ORAL | 1 refills | Status: DC
  Filled 2023-04-01 – 2023-08-01 (×2): qty 90, 90d supply, fill #0

## 2023-04-02 ENCOUNTER — Other Ambulatory Visit: Payer: Self-pay

## 2023-04-09 ENCOUNTER — Other Ambulatory Visit: Payer: Self-pay

## 2023-04-17 ENCOUNTER — Telehealth: Payer: Self-pay

## 2023-04-17 NOTE — Telephone Encounter (Signed)
Returned patient's call with interpreter#[id number]. Advised patient to call [facility] directly and schedule mammogram with Medicare coverage. [facility]

## 2023-04-25 ENCOUNTER — Ambulatory Visit: Payer: Medicare Other | Attending: Nurse Practitioner | Admitting: Pharmacist

## 2023-04-25 ENCOUNTER — Encounter: Payer: Self-pay | Admitting: Pharmacist

## 2023-04-25 VITALS — BP 116/77

## 2023-04-25 DIAGNOSIS — Z79899 Other long term (current) drug therapy: Secondary | ICD-10-CM | POA: Insufficient documentation

## 2023-04-25 DIAGNOSIS — I10 Essential (primary) hypertension: Secondary | ICD-10-CM | POA: Insufficient documentation

## 2023-04-25 NOTE — Progress Notes (Signed)
   S:    PCP: Sandra Gardner  Patient presents to the clinic for hypertension evaluation, counseling, and management. Patient was referred and last seen by Primary Care Provider on 03/27/2023, at which time BP was 145/75 mmHg.  Although patient endorsed adherence, he dispense record showed that she hadn't filled her amlodipine since 10/2022.  Today, patient arrives in good spirits. States she is taking amlodipine with no missed doses. However, the refill that was sent 04/01/23 was not picked up from our pharmacy. Denies headaches, dizziness, swelling, chest pain, blurred vision.   Medication adherence reported with no missed doses. Again, fill records indicate non-adherence, however, she did take the medication today.   Current BP Medications include: amlodipine 10 mg daily  Antihypertensives tried in the past include: none  Dietary habits include: cooks own food, eats salad, vegetables, little salt added to food Exercise habits include: walks all throughout the day, exercises 15-20 minutes at home (weights) Family / Social history:  -Tobacco: never smoker -Fhx: DM in father, thyroid disease in sister  O:  Home BP readings: does not check at home  Vitals:   04/25/23 1420  BP: 116/77     Last 3 Office BP readings: BP Readings from Last 3 Encounters:  03/27/23 (!) 145/75  09/17/22 (!) 168/94  06/13/22 136/79    BMET    Component Value Date/Time   NA 140 09/17/2022 1437   K 3.6 09/17/2022 1437   CL 101 09/17/2022 1437   CO2 24 09/17/2022 1437   GLUCOSE 116 (H) 09/17/2022 1437   GLUCOSE 140 (H) 12/03/2020 0500   BUN 13 09/17/2022 1437   CREATININE 0.61 09/17/2022 1437   CREATININE 0.62 08/17/2016 1620   CALCIUM 9.9 09/17/2022 1437   GFRNONAA >60 12/03/2020 0500   GFRAA 117 02/04/2019 1557    Renal function: CrCl cannot be calculated (Patient's most recent lab result is older than the maximum 21 days allowed.).  Clinical ASCVD: No  The 10-year ASCVD risk score (Arnett DK, et  al., 2019) is: 18.4%   Values used to calculate the score:     Age: 67 years     Sex: Female     Is Non-Hispanic African American: No     Diabetic: Yes     Tobacco smoker: No     Systolic Blood Pressure: 145 mmHg     Is BP treated: Yes     HDL Cholesterol: 50 mg/dL     Total Cholesterol: 165 mg/dL  A/P:  Hypertension currently at goal on current medications. BP Goal = <130/80 mmHg. Medication adherence appears appropriate.  -Continue amlodipine 10 mg daily -Counseled on lifestyle modifications for blood pressure control including reduced dietary sodium, increased exercise, adequate sleep. -Encouraged adherence and regular refills with our pharmacy.  Total time in face-to-face counseling 20 minutes.  -Follow-up: with me, prn   Butch Penny, PharmD, [place], CPP Clinical Pharmacist [facility] [phone]

## 2023-05-08 ENCOUNTER — Ambulatory Visit
Admission: RE | Admit: 2023-05-08 | Discharge: 2023-05-08 | Disposition: A | Discharge status: Home / Self Care | Payer: Medicare Other | Source: Ambulatory Visit | Attending: Nurse Practitioner | Admitting: Nurse Practitioner

## 2023-05-08 DIAGNOSIS — Z1231 Encounter for screening mammogram for malignant neoplasm of breast: Secondary | ICD-10-CM

## 2023-05-13 ENCOUNTER — Other Ambulatory Visit: Payer: Self-pay | Admitting: Nurse Practitioner

## 2023-05-13 DIAGNOSIS — R928 Other abnormal and inconclusive findings on diagnostic imaging of breast: Secondary | ICD-10-CM

## 2023-05-16 ENCOUNTER — Other Ambulatory Visit: Payer: Self-pay | Admitting: Nurse Practitioner

## 2023-05-16 ENCOUNTER — Ambulatory Visit
Admission: RE | Admit: 2023-05-16 | Discharge: 2023-05-16 | Disposition: A | Discharge status: Home / Self Care | Payer: Medicare Other | Source: Ambulatory Visit | Attending: Nurse Practitioner | Admitting: Nurse Practitioner

## 2023-05-16 DIAGNOSIS — R928 Other abnormal and inconclusive findings on diagnostic imaging of breast: Secondary | ICD-10-CM

## 2023-05-16 DIAGNOSIS — N6320 Unspecified lump in the left breast, unspecified quadrant: Secondary | ICD-10-CM

## 2023-08-01 ENCOUNTER — Other Ambulatory Visit: Payer: Self-pay

## 2023-08-28 ENCOUNTER — Encounter: Payer: Self-pay | Admitting: Nurse Practitioner

## 2023-08-28 ENCOUNTER — Other Ambulatory Visit: Payer: Self-pay

## 2023-08-28 ENCOUNTER — Ambulatory Visit: Payer: Medicare Other | Attending: Nurse Practitioner | Admitting: Nurse Practitioner

## 2023-08-28 VITALS — BP 136/78 | HR 72 | Resp 19 | Ht <= 58 in | Wt 121.0 lb

## 2023-08-28 DIAGNOSIS — Z79899 Other long term (current) drug therapy: Secondary | ICD-10-CM | POA: Diagnosis not present

## 2023-08-28 DIAGNOSIS — E559 Vitamin D deficiency, unspecified: Secondary | ICD-10-CM | POA: Insufficient documentation

## 2023-08-28 DIAGNOSIS — E1165 Type 2 diabetes mellitus with hyperglycemia: Secondary | ICD-10-CM | POA: Diagnosis present

## 2023-08-28 DIAGNOSIS — I10 Essential (primary) hypertension: Secondary | ICD-10-CM | POA: Diagnosis not present

## 2023-08-28 DIAGNOSIS — H6121 Impacted cerumen, right ear: Secondary | ICD-10-CM | POA: Diagnosis not present

## 2023-08-28 DIAGNOSIS — Z7984 Long term (current) use of oral hypoglycemic drugs: Secondary | ICD-10-CM | POA: Insufficient documentation

## 2023-08-28 DIAGNOSIS — E782 Mixed hyperlipidemia: Secondary | ICD-10-CM | POA: Insufficient documentation

## 2023-08-28 LAB — POCT GLYCOSYLATED HEMOGLOBIN (HGB A1C): Hemoglobin A1C: 7 % — AB (ref 4.0–5.6)

## 2023-08-28 MED ORDER — METFORMIN HCL 500 MG PO TABS: ORAL_TABLET | ORAL | Status: DC

## 2023-08-28 MED ORDER — ATORVASTATIN CALCIUM 10 MG PO TABS
10.0000 mg | ORAL_TABLET | Freq: Every day | ORAL | 3 refills | Status: DC
  Filled 2023-08-28: qty 90, 90d supply, fill #0

## 2023-08-28 MED ORDER — DEBROX 6.5 % OT SOLN
5.0000 [drp] | Freq: Two times a day (BID) | OTIC | 1 refills | Status: DC
  Filled 2023-08-28: qty 15, 30d supply, fill #0

## 2023-08-28 NOTE — Progress Notes (Signed)
 Assessment & Plan:  Sandra Gardner was seen today for medical management of chronic issues.  Diagnoses and all orders for this visit:  Type 2 diabetes mellitus with hyperglycemia, without long-term current use of insulin Increase metformin  to 1 tablet in am and 2 tablets in pm -     POCT glycosylated hemoglobin (Hb A1C) -     Urine Albumin/Creatinine with ratio (send out) [LAB689] -     metFORMIN  (GLUCOPHAGE ) 500 MG tablet; May take 1 tablet by mouth in the morning and 2 tablets by mouth in the evening -     CMP14+EGFR  Mixed hyperlipidemia -     atorvastatin  (LIPITOR) 10 MG tablet; Take 1 tablet (10 mg total) by mouth daily. INSTRUCTIONS: Work on a low fat, heart healthy diet and participate in regular aerobic exercise program by working out at least 150 minutes per week; 5 days a week-30 minutes per day. Avoid red meat/beef/steak,  fried foods. junk foods, sodas, sugary drinks, unhealthy snacking, alcohol and smoking.  Drink at least 80 oz of water per day and monitor your carbohydrate intake daily.    Vitamin D  deficiency disease -     VITAMIN D  25 Hydroxy (Vit-D Deficiency, Fractures)  Cerumen debris on tympanic membrane of right ear -     carbamide peroxide (DEBROX) 6.5 % OTIC solution; Place 5 drops into the right ear 2 (two) times daily.    Patient has been counseled on age-appropriate routine health concerns for screening and prevention. These are reviewed and up-to-date. Referrals have been placed accordingly. Immunizations are up-to-date or declined.    Subjective:   Chief Complaint  Patient presents with   Medical Management of Chronic Issues    Sandra Gardner 68 y.o. female presents to office today for follow up to HTN and DM  She has a past medical history of Cystocele, Hypertension, Urinary incontinence, DM 2 and Vaginal atrophy.    VRI was used to communicate directly with patient for the entire encounter including providing detailed patient instructions.      HTN Blood pressure is well controlled. She is taking amlodipine  10 mg daily.  BP Readings from Last 3 Encounters:  08/28/23 136/78  04/25/23 116/77  03/27/23 (!) 145/75     DM 2 Diabetes not controlled. A1c not at goal of <6.5. She is currently taking metformin  500 mg BID Lab Results  Component Value Date   HGBA1C 7.0 (A) 08/28/2023    Lab Results  Component Value Date   HGBA1C 7.0 (A) 03/27/2023       Review of Systems  Constitutional:  Negative for fever, malaise/fatigue and weight loss.  HENT: Negative.  Negative for nosebleeds.   Eyes: Negative.  Negative for blurred vision, double vision and photophobia.  Respiratory: Negative.  Negative for cough and shortness of breath.   Cardiovascular: Negative.  Negative for chest pain, palpitations and leg swelling.  Gastrointestinal: Negative.  Negative for heartburn, nausea and vomiting.  Musculoskeletal: Negative.  Negative for myalgias.  Neurological: Negative.  Negative for dizziness, focal weakness, seizures and headaches.  Psychiatric/Behavioral: Negative.  Negative for suicidal ideas.     Past Medical History:  Diagnosis Date   Cystocele    Diabetes mellitus without complication (HCC)    Hypertension    Urinary incontinence    Vaginal atrophy     Past Surgical History:  Procedure Laterality Date   CESAREAN SECTION     3 previous   CHOLECYSTECTOMY     COLONOSCOPY N/A 10/18/2016  Procedure: COLONOSCOPY;  Surgeon: Margo LITTIE Haddock, MD;  [facility];  Service: Endoscopy;  Laterality: N/A;  1230    ENDOMETRIAL ABLATION  2012   SHOULDER SURGERY Right    TUBAL LIGATION     BTL-  07-8012    Family History  Problem Relation Age of Onset   Diabetes Father    Thyroid  disease Sister    Hypertension Paternal Grandmother    Breast cancer Neg Hx     Social History Reviewed with no changes to be made today.   Outpatient Medications Prior to Visit  Medication Sig Dispense Refill   amLODipine   (NORVASC ) 10 MG tablet Take 1 tablet (10 mg total) by mouth daily. 90 tablet 1   Cholecalciferol (VITAMIN D3 PO) Take 1 tablet by mouth daily.     Omega 3 1000 MG CAPS Take 1,000 mg by mouth daily.     vitamin B-12 (CYANOCOBALAMIN) 1000 MCG tablet Take 1,000 mcg by mouth daily.     atorvastatin  (LIPITOR) 10 MG tablet Take 1 tablet (10 mg total) by mouth daily. Please fill as a 90 day supply 90 tablet 3   metFORMIN  (GLUCOPHAGE ) 500 MG tablet Take 1 tablet (500 mg total) by mouth 2 (two) times daily with a meal. 180 tablet 1   Vitamin D , Ergocalciferol , (DRISDOL ) 1.25 MG (50000 UNIT) CAPS capsule Take 1 capsule (50,000 Units total) by mouth every 7 (seven) days. (Patient not taking: Reported on 08/28/2023) 12 capsule 1   No facility-administered medications prior to visit.    Allergies  Allergen Reactions   Apple Juice Swelling   Cherry     swelling   Citrus Aurantium [Citrus]        Objective:    BP 136/78 (BP Location: Left Arm, Patient Position: Sitting, Cuff Size: Normal)   Pulse 72   Resp 19   Ht 4' 9 (1.448 m)   Wt 121 lb (54.9 kg)   SpO2 97%   BMI 26.18 kg/m  Wt Readings from Last 3 Encounters:  08/28/23 121 lb (54.9 kg)  03/27/23 120 lb 6.4 oz (54.6 kg)  09/17/22 116 lb 6.4 oz (52.8 kg)    Physical Exam Vitals and nursing note reviewed.  Constitutional:      Appearance: She is well-developed.  HENT:     Head: Normocephalic and atraumatic.     Ears:     Comments: Epithelial cell Debris in right ear Cardiovascular:     Rate and Rhythm: Normal rate and regular rhythm.     Heart sounds: Normal heart sounds. No murmur heard.    No friction rub. No gallop.  Pulmonary:     Effort: Pulmonary effort is normal. No tachypnea or respiratory distress.     Breath sounds: Normal breath sounds. No decreased breath sounds, wheezing, rhonchi or rales.  Chest:     Chest wall: No tenderness.  Abdominal:     General: Bowel sounds are normal.     Palpations: Abdomen is soft.   Musculoskeletal:        General: Normal range of motion.     Cervical back: Normal range of motion.  Skin:    General: Skin is warm and dry.  Neurological:     Mental Status: She is alert and oriented to person, place, and time.     Coordination: Coordination normal.  Psychiatric:        Behavior: Behavior normal. Behavior is cooperative.        Thought Content: Thought content normal.  Judgment: Judgment normal.          Patient has been counseled extensively about nutrition and exercise as well as the importance of adherence with medications and regular follow-up. The patient was given clear instructions to go to ER or return to medical center if symptoms don't improve, worsen or new problems develop. The patient verbalized understanding.   Follow-up: Return in about 3 months (around 11/25/2023).   Haze LELON Servant, FNP-BC [facility] [place], [place] [phone]   08/28/2023, 12:00 PM

## 2023-08-30 LAB — VITAMIN D 25 HYDROXY (VIT D DEFICIENCY, FRACTURES): Vit D, 25-Hydroxy: 21 ng/mL — ABNORMAL LOW (ref 30.0–100.0)

## 2023-08-30 LAB — CMP14+EGFR
ALT: 16 [IU]/L (ref 0–32)
AST: 21 [IU]/L (ref 0–40)
Albumin: 4.4 g/dL (ref 3.9–4.9)
Alkaline Phosphatase: 103 [IU]/L (ref 44–121)
BUN/Creatinine Ratio: 18 (ref 12–28)
BUN: 11 mg/dL (ref 8–27)
Bilirubin Total: 0.4 mg/dL (ref 0.0–1.2)
CO2: 24 mmol/L (ref 20–29)
Calcium: 9.6 mg/dL (ref 8.7–10.3)
Chloride: 99 mmol/L (ref 96–106)
Creatinine, Ser: 0.6 mg/dL (ref 0.57–1.00)
Globulin, Total: 3.1 g/dL (ref 1.5–4.5)
Glucose: 186 mg/dL — ABNORMAL HIGH (ref 70–99)
Potassium: 4.2 mmol/L (ref 3.5–5.2)
Sodium: 139 mmol/L (ref 134–144)
Total Protein: 7.5 g/dL (ref 6.0–8.5)
eGFR: 98 mL/min/{1.73_m2} (ref >59)

## 2023-08-30 LAB — MICROALBUMIN / CREATININE URINE RATIO
Creatinine, Urine: 20.5 mg/dL
Microalb/Creat Ratio: 28 mg/g{creat} (ref 0–29)
Microalbumin, Urine: 5.8 ug/mL

## 2023-11-20 ENCOUNTER — Ambulatory Visit
Admission: RE | Admit: 2023-11-20 | Discharge: 2023-11-20 | Disposition: A | Discharge status: Home / Self Care | Payer: Medicare Other | Source: Ambulatory Visit | Attending: Nurse Practitioner | Admitting: Nurse Practitioner

## 2023-11-20 DIAGNOSIS — N6320 Unspecified lump in the left breast, unspecified quadrant: Secondary | ICD-10-CM

## 2023-11-21 ENCOUNTER — Other Ambulatory Visit: Payer: Self-pay | Admitting: Nurse Practitioner

## 2023-11-21 DIAGNOSIS — N6489 Other specified disorders of breast: Secondary | ICD-10-CM

## 2023-11-27 ENCOUNTER — Other Ambulatory Visit: Payer: Self-pay

## 2023-11-27 ENCOUNTER — Encounter: Payer: Self-pay | Admitting: Nurse Practitioner

## 2023-11-27 ENCOUNTER — Ambulatory Visit: Payer: Medicare Other | Attending: Nurse Practitioner | Admitting: Nurse Practitioner

## 2023-11-27 VITALS — BP 152/82 | HR 68 | Resp 20 | Ht <= 58 in | Wt 120.8 lb

## 2023-11-27 DIAGNOSIS — Z79899 Other long term (current) drug therapy: Secondary | ICD-10-CM | POA: Insufficient documentation

## 2023-11-27 DIAGNOSIS — Z8742 Personal history of other diseases of the female genital tract: Secondary | ICD-10-CM | POA: Insufficient documentation

## 2023-11-27 DIAGNOSIS — Z7984 Long term (current) use of oral hypoglycemic drugs: Secondary | ICD-10-CM | POA: Insufficient documentation

## 2023-11-27 DIAGNOSIS — E1165 Type 2 diabetes mellitus with hyperglycemia: Secondary | ICD-10-CM | POA: Diagnosis present

## 2023-11-27 DIAGNOSIS — I10 Essential (primary) hypertension: Secondary | ICD-10-CM | POA: Insufficient documentation

## 2023-11-27 DIAGNOSIS — E782 Mixed hyperlipidemia: Secondary | ICD-10-CM | POA: Diagnosis not present

## 2023-11-27 DIAGNOSIS — Z87448 Personal history of other diseases of urinary system: Secondary | ICD-10-CM | POA: Insufficient documentation

## 2023-11-27 LAB — POCT GLYCOSYLATED HEMOGLOBIN (HGB A1C): HbA1c, POC (controlled diabetic range): 6.6 % (ref 0.0–7.0)

## 2023-11-27 MED ORDER — METFORMIN HCL 500 MG PO TABS
ORAL_TABLET | ORAL | 1 refills | Status: AC
  Filled 2023-11-27: qty 270, 90d supply, fill #0
  Filled 2024-03-05: qty 270, 90d supply, fill #1

## 2023-11-27 MED ORDER — AMLODIPINE BESYLATE 10 MG PO TABS
10.0000 mg | ORAL_TABLET | Freq: Every day | ORAL | 1 refills | Status: AC
  Filled 2023-11-27: qty 90, 90d supply, fill #0
  Filled 2024-03-05: qty 90, 90d supply, fill #1

## 2023-11-27 MED ORDER — ATORVASTATIN CALCIUM 10 MG PO TABS
10.0000 mg | ORAL_TABLET | Freq: Every day | ORAL | 3 refills | Status: AC
  Filled 2023-11-27: qty 90, 90d supply, fill #0
  Filled 2024-03-05: qty 90, 90d supply, fill #1

## 2023-11-27 NOTE — Progress Notes (Signed)
 Assessment & Plan:  Sandra Gardner was seen today for diabetes.  Diagnoses and all orders for this visit:  Type 2 diabetes mellitus with hyperglycemia, without long-term current use of insulin (HCC) -     POCT glycosylated hemoglobin (Hb A1C) -     CMP14+EGFR -     metFORMIN  (GLUCOPHAGE ) 500 MG tablet; Take 1 tablet (500 mg total) by mouth in the morning AND 2 tablets (1,000 mg total) every evening.FOR DIABETES. Continue blood sugar control as discussed in office today, low carbohydrate diet, and regular physical exercise as tolerated, 150 minutes per week (30 min each day, 5 days per week, or 50 min 3 days per week). Keep blood sugar logs with fasting goal of 90-130 mg/dl, post prandial (after you eat) less than 180.  For Hypoglycemia: BS <60 and Hyperglycemia BS >400; contact the clinic ASAP. Annual eye exams and foot exams are recommended.   Primary hypertension -     amLODipine  (NORVASC ) 10 MG tablet; Take 1 tablet (10 mg total) by mouth daily. FOR BLOOD PRESSURE Continue all antihypertensives as prescribed.  Reminded to bring in blood pressure log for follow  up appointment.  RECOMMENDATIONS: DASH/Mediterranean Diets are healthier choices for HTN.    Mixed hyperlipidemia -     atorvastatin  (LIPITOR) 10 MG tablet; Take 1 tablet (10 mg total) by mouth daily. FOR CHOLESTEROL INSTRUCTIONS: Work on a low fat, heart healthy diet and participate in regular aerobic exercise program by working out at least 150 minutes per week; 5 days a week-30 minutes per day. Avoid red meat/beef/steak,  fried foods. junk foods, sodas, sugary drinks, unhealthy snacking, alcohol and smoking.  Drink at least 80 oz of water per day and monitor your carbohydrate intake daily.      Patient has been counseled on age-appropriate routine health concerns for screening and prevention. These are reviewed and up-to-date. Referrals have been placed accordingly. Immunizations are up-to-date or declined.    Subjective:   Chief  Complaint  Patient presents with   Diabetes    Sandra Gardner 68 y.o. female presents to office today  for follow up to HTN and DM   She has a past medical history of Cystocele, Hypertension, Urinary incontinence, DM 2 and Vaginal atrophy.    VRI was used to communicate directly with patient for the entire encounter including providing detailed patient instructions.     HTN Blood pressure is elevated today. She endorses adherence taking amlodipine  10 mg daily.  BP Readings from Last 3 Encounters:  11/27/23 (!) 152/82  08/28/23 136/78  04/25/23 116/77      DM 2 Diabetes is well controlled and A1c down from 7.0 to 6.6. Weight is stable. She is making significant dietary modifications. Currently taking metformin  500 mg in the a.m. and 1000 mg in the p.m. Lab Results  Component Value Date   HGBA1C 6.6 11/27/2023    Lab Results  Component Value Date   HGBA1C 7.0 (A) 08/28/2023     She is going to [place] in September and would like to know if she can have enough medications to cover her while she is there for 2 months.   Review of Systems  Constitutional:  Negative for fever, malaise/fatigue and weight loss.  HENT: Negative.  Negative for nosebleeds.   Eyes: Negative.  Negative for blurred vision, double vision and photophobia.  Respiratory: Negative.  Negative for cough and shortness of breath.   Cardiovascular: Negative.  Negative for chest pain, palpitations and leg swelling.  Gastrointestinal: Negative.  Negative for heartburn, nausea and vomiting.  Musculoskeletal: Negative.  Negative for myalgias.  Neurological: Negative.  Negative for dizziness, focal weakness, seizures and headaches.  Psychiatric/Behavioral: Negative.  Negative for suicidal ideas.     Past Medical History:  Diagnosis Date   Cystocele    Diabetes mellitus without complication (HCC)    Hypertension    Urinary incontinence    Vaginal atrophy     Past Surgical History:  Procedure  Laterality Date   CESAREAN SECTION     3 previous   CHOLECYSTECTOMY     COLONOSCOPY N/A 10/18/2016   Procedure: COLONOSCOPY;  Surgeon: Alyce Jubilee, MD;  [facility];  Service: Endoscopy;  Laterality: N/A;  1230    ENDOMETRIAL ABLATION  2012   SHOULDER SURGERY Right    TUBAL LIGATION     BTL-  [id number]    Family History  Problem Relation Age of Onset   Diabetes Father    Thyroid  disease Sister    Hypertension Paternal Grandmother    Breast cancer Neg Hx     Social History Reviewed with no changes to be made today.   Outpatient Medications Prior to Visit  Medication Sig Dispense Refill   Cholecalciferol (VITAMIN D3 PO) Take 1 tablet by mouth daily.     Omega 3 1000 MG CAPS Take 1,000 mg by mouth daily.     vitamin B-12 (CYANOCOBALAMIN) 1000 MCG tablet Take 1,000 mcg by mouth daily.     amLODipine  (NORVASC ) 10 MG tablet Take 1 tablet (10 mg total) by mouth daily. 90 tablet 1   atorvastatin  (LIPITOR) 10 MG tablet Take 1 tablet (10 mg total) by mouth daily. 90 tablet 3   metFORMIN  (GLUCOPHAGE ) 500 MG tablet May take 1 tablet by mouth in the morning and 2 tablets by mouth in the evening     carbamide peroxide (DEBROX) 6.5 % OTIC solution Place 5 drops into the right ear 2 (two) times daily. (Patient not taking: Reported on 11/27/2023) 15 mL 1   Vitamin D , Ergocalciferol , (DRISDOL ) 1.25 MG (50000 UNIT) CAPS capsule Take 1 capsule (50,000 Units total) by mouth every 7 (seven) days. (Patient not taking: Reported on 11/27/2023) 12 capsule 1   No facility-administered medications prior to visit.    Allergies  Allergen Reactions   Apple Juice Swelling   Cherry     swelling   Citrus Aurantium [Citrus]        Objective:    BP (!) 152/82 (BP Location: Left Arm, Patient Position: Sitting, Cuff Size: Normal)   Pulse 68   Resp 20   Ht 4\' 9"  (1.448 m)   Wt 120 lb 12.8 oz (54.8 kg)   SpO2 100%   BMI 26.14 kg/m  Wt Readings from Last 3 Encounters:  11/27/23 120 lb 12.8 oz  (54.8 kg)  08/28/23 121 lb (54.9 kg)  03/27/23 120 lb 6.4 oz (54.6 kg)    Physical Exam Vitals and nursing note reviewed.  Constitutional:      Appearance: She is well-developed.  HENT:     Head: Normocephalic and atraumatic.  Cardiovascular:     Rate and Rhythm: Normal rate and regular rhythm.     Heart sounds: Normal heart sounds. No murmur heard.    No friction rub. No gallop.  Pulmonary:     Effort: Pulmonary effort is normal. No tachypnea or respiratory distress.     Breath sounds: Normal breath sounds. No decreased breath sounds, wheezing, rhonchi or rales.  Chest:     Chest wall: No tenderness.  Abdominal:     General: Bowel sounds are normal.     Palpations: Abdomen is soft.  Musculoskeletal:        General: Normal range of motion.     Cervical back: Normal range of motion.  Skin:    General: Skin is warm and dry.  Neurological:     Mental Status: She is alert and oriented to person, place, and time.     Coordination: Coordination normal.  Psychiatric:        Behavior: Behavior normal. Behavior is cooperative.        Thought Content: Thought content normal.        Judgment: Judgment normal.          Patient has been counseled extensively about nutrition and exercise as well as the importance of adherence with medications and regular follow-up. The patient was given clear instructions to go to ER or return to medical center if symptoms don't improve, worsen or new problems develop. The patient verbalized understanding.   Follow-up: Return in about 6 months (around 05/29/2024).   Collins Dean, FNP-BC [facility] [place], [place] [phone]   11/27/2023, 11:52 AM

## 2023-11-28 LAB — CMP14+EGFR
ALT: 16 IU/L (ref 0–32)
AST: 17 IU/L (ref 0–40)
Albumin: 4.2 g/dL (ref 3.9–4.9)
Alkaline Phosphatase: 102 IU/L (ref 44–121)
BUN/Creatinine Ratio: 18 (ref 12–28)
BUN: 11 mg/dL (ref 8–27)
Bilirubin Total: 0.3 mg/dL (ref 0.0–1.2)
CO2: 22 mmol/L (ref 20–29)
Calcium: 9.5 mg/dL (ref 8.7–10.3)
Chloride: 102 mmol/L (ref 96–106)
Creatinine, Ser: 0.6 mg/dL (ref 0.57–1.00)
Globulin, Total: 2.6 g/dL (ref 1.5–4.5)
Glucose: 158 mg/dL — ABNORMAL HIGH (ref 70–99)
Potassium: 4.1 mmol/L (ref 3.5–5.2)
Sodium: 139 mmol/L (ref 134–144)
Total Protein: 6.8 g/dL (ref 6.0–8.5)
eGFR: 98 mL/min/{1.73_m2} (ref >59)

## 2023-12-05 ENCOUNTER — Ambulatory Visit: Payer: Self-pay | Admitting: Nurse Practitioner

## 2023-12-06 ENCOUNTER — Other Ambulatory Visit: Payer: Self-pay

## 2024-03-05 ENCOUNTER — Other Ambulatory Visit: Payer: Self-pay

## 2024-03-10 ENCOUNTER — Other Ambulatory Visit: Payer: Self-pay

## 2024-05-13 ENCOUNTER — Telehealth: Payer: Self-pay | Admitting: Nurse Practitioner

## 2024-05-13 NOTE — Telephone Encounter (Signed)
 Contacted pt left vm to resch appt (if she calls back resch her appt cp not in the office)

## 2024-05-18 ENCOUNTER — Telehealth: Payer: Self-pay | Admitting: Nurse Practitioner

## 2024-05-18 NOTE — Telephone Encounter (Signed)
 2nd attempt lvm to resch appt pcp not in the office ( if she call back resch her appt!

## 2024-05-25 ENCOUNTER — Encounter

## 2024-05-29 ENCOUNTER — Ambulatory Visit: Admitting: Nurse Practitioner
